# Patient Record
Sex: Female | Born: 1995 | Marital: Single | Smoking: Never smoker
Health system: Southern US, Community
[De-identification: ages and names within clinical notes are randomized; demographics above are authoritative.]

## PROBLEM LIST (undated history)

## (undated) DIAGNOSIS — T4145XA Adverse effect of unspecified anesthetic, initial encounter: Secondary | ICD-10-CM

## (undated) DIAGNOSIS — Z8619 Personal history of other infectious and parasitic diseases: Secondary | ICD-10-CM

## (undated) DIAGNOSIS — Z87442 Personal history of urinary calculi: Secondary | ICD-10-CM

## (undated) DIAGNOSIS — J189 Pneumonia, unspecified organism: Secondary | ICD-10-CM

## (undated) DIAGNOSIS — H905 Unspecified sensorineural hearing loss: Secondary | ICD-10-CM

## (undated) DIAGNOSIS — T8859XA Other complications of anesthesia, initial encounter: Secondary | ICD-10-CM

## (undated) HISTORY — PX: EXTERNAL EAR SURGERY: SHX627

## (undated) HISTORY — DX: Personal history of other infectious and parasitic diseases: Z86.19

## (undated) HISTORY — PX: OTHER SURGICAL HISTORY: SHX169

## (undated) HISTORY — DX: Unspecified sensorineural hearing loss: H90.5

## (undated) HISTORY — PX: WISDOM TOOTH EXTRACTION: SHX21

## (undated) HISTORY — PX: FOOT SURGERY: SHX648

---

## 2016-08-06 ENCOUNTER — Ambulatory Visit (INDEPENDENT_AMBULATORY_CARE_PROVIDER_SITE_OTHER): Payer: Managed Care, Other (non HMO) | Admitting: Family Medicine

## 2016-08-06 ENCOUNTER — Encounter: Payer: Self-pay | Admitting: Family Medicine

## 2016-08-06 VITALS — BP 112/54 | HR 78 | Temp 98.8°F | Ht 61.75 in | Wt 111.8 lb

## 2016-08-06 DIAGNOSIS — Z23 Encounter for immunization: Secondary | ICD-10-CM

## 2016-08-06 DIAGNOSIS — M940 Chondrocostal junction syndrome [Tietze]: Secondary | ICD-10-CM | POA: Diagnosis not present

## 2016-08-06 DIAGNOSIS — M546 Pain in thoracic spine: Secondary | ICD-10-CM | POA: Diagnosis not present

## 2016-08-06 MED ORDER — NAPROXEN 500 MG PO TABS: 500.0000 mg | ORAL_TABLET | Freq: Two times a day (BID) | ORAL | 0 refills | Status: DC

## 2016-08-06 MED ORDER — CYCLOBENZAPRINE HCL 10 MG PO TABS: 10.0000 mg | ORAL_TABLET | Freq: Three times a day (TID) | ORAL | 0 refills | Status: DC | PRN

## 2016-08-06 NOTE — Progress Notes (Signed)
Pre visit review using our clinic review tool, if applicable. No additional management support is needed unless otherwise documented below in the visit note. 

## 2016-08-06 NOTE — Progress Notes (Signed)
Chief Complaint  Patient presents with  . Establish Care    pt is having chest pain (upper)-started on Friday night.Also back pain mid (R) side(comes and goes).       New Patient Visit SUBJECTIVE: HPI: Jody Murray is an 20 y.o.female who is being seen for establishing care.  The patient has not been seen by a PCP in years.  Chest pain Started 6 days ago, located on upper chest. On both sides, it comes and goes. She does box frequently, no new sparring or workouts. No injuries, numbness or tingling. It is a sharp pain and rated as a 7/10. Nothing she notices makes it better or worse. She does not have the pain now. She has been using Benadryl and ibuprofen that has been helpful.    She also has been having intermittent R sided back pain. Also sharp, no numbness, tingling or weakness. She has noticed some help with the ibuprofen as well. No urinary complaints.  No Known Allergies  Past Medical History:  Diagnosis Date  . History of chicken pox    Past Surgical History:  Procedure Laterality Date  . EXTERNAL EAR SURGERY Right   . FOOT SURGERY Left   . History of kidney stones     Social History   Social History  . Marital status: Single   Social History Main Topics  . Smoking status: Never Smoker  . Smokeless tobacco: Never Used  . Alcohol use No  . Drug use: No   Family History  Problem Relation Age of Onset  . Cancer Maternal Grandmother     Breast  . Cancer Paternal Grandmother   . Cancer Paternal Grandfather      Current Outpatient Prescriptions:  .  cyclobenzaprine (FLEXERIL) 10 MG tablet, Take 1 tablet (10 mg total) by mouth 3 (three) times daily as needed for muscle spasms. If it makes you too groggy, cut pill in half., Disp: 30 tablet, Rfl: 0 .  naproxen (NAPROSYN) 500 MG tablet, Take 1 tablet (500 mg total) by mouth 2 (two) times daily with a meal., Disp: 30 tablet, Rfl: 0  Patient's last menstrual period was 07/13/2016  (approximate).  ROS Cardiovascular: + chest pain  MSK: +back pain   OBJECTIVE: BP (!) 112/54 (BP Location: Left Arm, Patient Position: Sitting, Cuff Size: Normal)   Pulse 78   Temp 98.8 F (37.1 C) (Oral)   Ht 5' 1.75" (1.568 m)   Wt 111 lb 12.8 oz (50.7 kg)   LMP 07/13/2016 (Approximate)   SpO2 (!) 78%   BMI 20.61 kg/m   Constitutional: -  VS reviewed -  Well developed, well nourished, appears stated age -  No apparent distress  Psychiatric: -  Oriented to person, place, and time -  Memory intact -  Affect and mood normal -  Fluent conversation, good eye contact -  Judgment and insight age appropriate  Eye: -  Conjunctivae clear, no discharge -  Pupils symmetric, round, reactive to light  Cardiovascular: -  RRR, no murmurs -  No LE edema  Respiratory: -  Normal respiratory effort, no accessory muscle use, no retraction -  Breath sounds equal, no wheezes, no ronchi, no crackles  Gastrointestinal: -  Bowel sounds normal -  No tenderness, no distention, no guarding, no masses  Neurological:  -  CN II - XII grossly intact -  Sensation grossly intact to light touch, equal bilaterally  Musculoskeletal: -  No clubbing, no cyanosis -  Gait normal -  I cannot  reproduce any tenderness to palpation in chest wall or right sided back pain  Skin: -  No significant lesion on inspection -  Warm and dry to palpation   ASSESSMENT/PLAN: Costochondritis - Plan: naproxen (NAPROSYN) 500 MG tablet, cyclobenzaprine (FLEXERIL) 10 MG tablet  Acute right-sided thoracic back pain - Plan: naproxen (NAPROSYN) 500 MG tablet, cyclobenzaprine (FLEXERIL) 10 MG tablet  Orders as above. Take 1/2 tab of Flexeril if she gets groggy. Patient should return in 1 yr or if symptoms worsen or fail to improve. The patient voiced understanding and agreement to the plan.   Jilda Rocheicholas Paul BentonWendling, DO 08/06/16  4:17 PM

## 2016-08-06 NOTE — Addendum Note (Signed)
Addended by: Verdie ShireBAYNES, ANGELA M on: 08/06/2016 05:01 PM   Modules accepted: Orders

## 2016-08-06 NOTE — Patient Instructions (Addendum)

## 2016-08-13 ENCOUNTER — Ambulatory Visit (HOSPITAL_BASED_OUTPATIENT_CLINIC_OR_DEPARTMENT_OTHER)
Admission: RE | Admit: 2016-08-13 | Discharge: 2016-08-13 | Disposition: A | Discharge status: Home / Self Care | Payer: Managed Care, Other (non HMO) | Source: Ambulatory Visit | Attending: Family Medicine | Admitting: Family Medicine

## 2016-08-13 ENCOUNTER — Encounter: Payer: Self-pay | Admitting: Family Medicine

## 2016-08-13 ENCOUNTER — Telehealth: Payer: Self-pay | Admitting: Family Medicine

## 2016-08-13 ENCOUNTER — Ambulatory Visit (INDEPENDENT_AMBULATORY_CARE_PROVIDER_SITE_OTHER): Payer: Managed Care, Other (non HMO) | Admitting: Family Medicine

## 2016-08-13 VITALS — BP 106/54 | HR 105 | Temp 98.1°F | Ht 62.0 in | Wt 110.6 lb

## 2016-08-13 DIAGNOSIS — R05 Cough: Secondary | ICD-10-CM | POA: Diagnosis not present

## 2016-08-13 DIAGNOSIS — R059 Cough, unspecified: Secondary | ICD-10-CM

## 2016-08-13 MED ORDER — BENZONATATE 100 MG PO CAPS: 100.0000 mg | ORAL_CAPSULE | Freq: Two times a day (BID) | ORAL | 0 refills | Status: DC | PRN

## 2016-08-13 NOTE — Patient Instructions (Signed)
Your chest X-ray looks normal. If a radiologist sees anything abnormal, we will let you know.  Continue practicing good hand hygiene, keep drinking fluids, and cover your mouth.  Claritin (loratadine), Allegra (fexofenadine), Zyrtec (cetirizine); these are listed in order from weakest to strongest. Generic, and therefore cheaper, options are in the parentheses.   Flonase (fluticasone); nasal spray that is over the counter. 2 sprays each nostril, once daily. Aim towards the same side eye when you spray.  There are available OTC, and the generic versions, which may be cheaper, are in parentheses. Show this to a pharmacist if you have trouble finding any of these items.

## 2016-08-13 NOTE — Telephone Encounter (Signed)
Do you hav eany recommendations before I reach out to the patient. TL/CMA

## 2016-08-13 NOTE — Progress Notes (Signed)
Pre visit review using our clinic review tool, if applicable. No additional management support is needed unless otherwise documented below in the visit note. 

## 2016-08-13 NOTE — Telephone Encounter (Signed)
Pt's mom says that she would like to be advised. She says that her daughter is having some other concerns. When seen she was complaining about chest pain. She says now she is vomiting along with it. She would like a call back.    OR if CMA would like, could call the pt instead at # on file.    Valentina GuLucy (mom) [phone]

## 2016-08-13 NOTE — Telephone Encounter (Signed)
I don't recall her having vomiting when I saw her. I would prefer she be seen.

## 2016-08-13 NOTE — Telephone Encounter (Signed)
I have spoken with Jody Murray who informed me that she currently does not have chest pain and has stopped coughing. But when I have spoken with her mom she states that she has been coughing up yellow phlem with black strings in it. Pt mother reports she had chest pain last night and had to run to the bathroom several times due to the gagging and choking of the phlem coming up. Pt states the naproxen has helped but muscle relaxant is causing drowsiness. I have scheduled patient for 215 to come see Dr. Carmelia RollerWendling. FYI only.TL/CMA

## 2016-08-13 NOTE — Progress Notes (Signed)
Chief Complaint  Patient presents with  . Follow-up    Pt reports having chest pain last night and still coughing up yellow phlem with black strings in it/ Non smoker and has history of pneumonia    Jody Murray here for URI complaints.  Duration: 2 days  Associated symptoms: felt warm, productive cough Denies: sinus congestion, rhinorrhea, itchy watery eyes, ear pain, ear drainage, sore throat and shortness of breath; of note, no N/V Treatment to date: fluids and rest Sick contacts: No  ROS:  HEENT: As noted in HPI Lungs: No SOB  Past Medical History:  Diagnosis Date  . History of chicken pox    Family History  Problem Relation Age of Onset  . Cancer Maternal Grandmother     Breast  . Cancer Paternal Grandmother   . Cancer Paternal Grandfather     BP (!) 106/54 (BP Location: Left Arm, Patient Position: Sitting, Cuff Size: Small)   Pulse (!) 105   Temp 98.1 F (36.7 C) (Oral)   Ht 5\' 2"  (1.575 m)   Wt 110 lb 9.6 oz (50.2 kg)   LMP 07/11/2016   SpO2 95%   BMI 20.23 kg/m  General: Awake, alert, appears stated age HEENT: AT, [facility], ears patent b/l and TM's neg, nares patent w/o discharge, no sinus tenderness, pharynx pink and without exudates, MMM Neck: No masses or asymmetry Heart: RRR, no murmurs, no bruits Lungs: CTAB, no accessory muscle use Psych: Age appropriate judgment and insight, normal mood and affect  Cough - Plan: DG Chest 2 View, benzonatate (TESSALON) 100 MG capsule  Orders as above. CXR neg, await final read. Seek more immediate care if she starts having fevers, shortness of breath, new symptoms. F/u in 1 week if symptoms worsen or fail to improve. Pt voiced understanding and agreement to the plan.  Jody Rocheicholas Paul MartellWendling, DO 08/13/16 3:07 PM

## 2018-01-26 ENCOUNTER — Ambulatory Visit (INDEPENDENT_AMBULATORY_CARE_PROVIDER_SITE_OTHER): Payer: Commercial Managed Care - PPO | Admitting: Family Medicine

## 2018-01-26 ENCOUNTER — Encounter: Payer: Self-pay | Admitting: Family Medicine

## 2018-01-26 ENCOUNTER — Ambulatory Visit (HOSPITAL_BASED_OUTPATIENT_CLINIC_OR_DEPARTMENT_OTHER)
Admission: RE | Admit: 2018-01-26 | Discharge: 2018-01-26 | Disposition: A | Discharge status: Home / Self Care | Payer: Commercial Managed Care - PPO | Source: Ambulatory Visit | Attending: Family Medicine | Admitting: Family Medicine

## 2018-01-26 VITALS — BP 102/62 | HR 81 | Temp 98.9°F | Ht 63.0 in | Wt 109.5 lb

## 2018-01-26 DIAGNOSIS — M79645 Pain in left finger(s): Secondary | ICD-10-CM | POA: Diagnosis not present

## 2018-01-26 DIAGNOSIS — Z114 Encounter for screening for human immunodeficiency virus [HIV]: Secondary | ICD-10-CM | POA: Diagnosis not present

## 2018-01-26 DIAGNOSIS — Z0000 Encounter for general adult medical examination without abnormal findings: Secondary | ICD-10-CM

## 2018-01-26 DIAGNOSIS — Z0001 Encounter for general adult medical examination with abnormal findings: Secondary | ICD-10-CM | POA: Diagnosis not present

## 2018-01-26 DIAGNOSIS — Z87442 Personal history of urinary calculi: Secondary | ICD-10-CM | POA: Diagnosis not present

## 2018-01-26 LAB — COMPREHENSIVE METABOLIC PANEL
ALBUMIN: 4.9 g/dL (ref 3.5–5.2)
ALK PHOS: 55 U/L (ref 39–117)
ALT: 15 U/L (ref 0–35)
AST: 17 U/L (ref 0–37)
BILIRUBIN TOTAL: 0.6 mg/dL (ref 0.2–1.2)
BUN: 13 mg/dL (ref 6–23)
CO2: 31 mEq/L (ref 19–32)
Calcium: 10.1 mg/dL (ref 8.4–10.5)
Chloride: 100 mEq/L (ref 96–112)
Creatinine, Ser: 0.61 mg/dL (ref 0.40–1.20)
GFR: 130.86 mL/min (ref >60.00)
Glucose, Bld: 97 mg/dL (ref 70–99)
POTASSIUM: 3.9 meq/L (ref 3.5–5.1)
SODIUM: 138 meq/L (ref 135–145)
Total Protein: 7.7 g/dL (ref 6.0–8.3)

## 2018-01-26 LAB — LIPID PANEL
CHOLESTEROL: 165 mg/dL (ref 0–200)
HDL: 46.6 mg/dL (ref >39.00)
LDL Cholesterol: 83 mg/dL (ref 0–99)
NonHDL: 118.14
Total CHOL/HDL Ratio: 4
Triglycerides: 176 mg/dL — ABNORMAL HIGH (ref 0.0–149.0)
VLDL: 35.2 mg/dL (ref 0.0–40.0)

## 2018-01-26 LAB — CBC
HEMATOCRIT: 40.7 % (ref 36.0–46.0)
HEMOGLOBIN: 13.7 g/dL (ref 12.0–15.0)
MCHC: 33.6 g/dL (ref 30.0–36.0)
MCV: 98.5 fl (ref 78.0–100.0)
Platelets: 259 10*3/uL (ref 150.0–400.0)
RBC: 4.13 Mil/uL (ref 3.87–5.11)
RDW: 12.7 % (ref 11.5–15.5)
WBC: 8.7 10*3/uL (ref 4.0–10.5)

## 2018-01-26 MED ORDER — TAMSULOSIN HCL 0.4 MG PO CAPS: 0.4000 mg | ORAL_CAPSULE | Freq: Every day | ORAL | 0 refills | Status: DC

## 2018-01-26 MED ORDER — TRAMADOL HCL 50 MG PO TABS: 50.0000 mg | ORAL_TABLET | Freq: Three times a day (TID) | ORAL | 0 refills | Status: DC | PRN

## 2018-01-26 NOTE — Patient Instructions (Addendum)
Keep up the good work.   MyChart message will be sent regarding both labs and for X-ray results.  If you do not hear anything about your referral in the next 1-2 weeks, call our office and ask for an update.  Let us know if you need anything.

## 2018-01-26 NOTE — Progress Notes (Signed)
Pre visit review using our clinic review tool, if applicable. No additional management support is needed unless otherwise documented below in the visit note. 

## 2018-01-26 NOTE — Progress Notes (Signed)
Chief Complaint  Patient presents with  . Annual Exam     Well Woman Jody Murray is here for a complete physical.  Here w mom. Her last physical was >1 year ago.  Current diet: in general, a "healthy" diet. Current exercise: Boxing, cardio, pull ups. Weight is stable and she denies daytime fatigue. No LMP recorded. Seatbelt? Yes  Health Maintenance Pap/HPV- No Tetanus- Yes HIV screening- No   Hx of kidney stones since she was 22 yo. Saw urology team in [place], has not had since coming here. 2 weeks of migrating flank pain with some radiation to and reminiscent of renal stones. No urinary complaints or fevers.  Broke finger around 1 year ago, prox phalanx of R 2nd digit. Still will cause pain when it is squeezed. Questionable fx care? No swelling or bruising. Has not had f/u imaging.  Past Medical History:  Diagnosis Date  . History of chicken pox      Past Surgical History:  Procedure Laterality Date  . EXTERNAL EAR SURGERY Right   . FOOT SURGERY Left   . History of kidney stones      Medications  Takes no meds routinely.   Allergies No Known Allergies  Review of Systems: Constitutional:  no fevers Eye:  no recent significant change in vision Ear/Nose/Mouth/Throat:  Ears:  no tinnitus or vertigo and no recent change in hearing, Nose/Mouth/Throat:  no complaints of nasal congestion, no sore throat Cardiovascular: no chest pain, no palpitations Respiratory:  no cough and no shortness of breath Gastrointestinal:  no abdominal pain, no change in bowel habits, no significant change in appetite, no nausea, vomiting, diarrhea, or constipation and no black or bloody stool GU:  Female: negative for dysuria, frequency, and incontinence; no abnormal bleeding, pelvic pain, or discharge Musculoskeletal/Extremities: +L index finger pain, +flank pain; otherwise no pain of the joints Integumentary (Skin/Breast):  no abnormal skin lesions reported Neurologic:  no headaches Endocrine:   denies fatigue Hematologic/Lymphatic:  no unexpected weight changes  Exam BP 102/62 (BP Location: Right Arm, Patient Position: Sitting, Cuff Size: Normal)   Pulse 81   Temp 98.9 F (37.2 C) (Oral)   Ht 5\' 3"  (1.6 m)   Wt 109 lb 8 oz (49.7 kg)   SpO2 97%   BMI 19.40 kg/m  General:  well developed, well nourished, in no apparent distress Skin:  no significant moles, warts, or growths Head:  no masses, lesions, or tenderness Eyes:  pupils equal and round, sclera anicteric without injection Ears:  canals without lesions, TMs shiny without retraction, no obvious effusion, no erythema Nose:  nares patent, septum midline, mucosa normal, and no drainage or sinus tenderness Throat/Pharynx:  lips and gingiva without lesion; tongue and uvula midline; non-inflamed pharynx; no exudates or postnasal drainage Neck: neck supple without adenopathy, thyromegaly, or masses Breasts:  Not done Thorax:  nontender Lungs:  clear to auscultation, breath sounds equal bilaterally, no respiratory distress Cardio:  regular rate and rhythm without murmurs, heart sounds without clicks or rubs, point of maximal impulse normal; no lifts, heaves, or thrills Abdomen:  abdomen soft, nontender; bowel sounds normal; no masses or organomegaly Genital: Defer to GYN Musculoskeletal:  symmetrical muscle groups noted without atrophy or deformity Extremities:  no clubbing, cyanosis, or edema, no deformities, no skin discoloration Neuro:  gait normal; deep tendon reflexes normal and symmetric Psych: well oriented with normal range of affect and appropriate judgment/insight  Assessment and Plan  Well adult exam - Plan: CBC, Comprehensive metabolic panel, Lipid  panel  History of kidney stones - Plan: Ambulatory referral to Urology  Finger pain, left - Plan: DG Finger Index Left  Screening for HIV (human immunodeficiency virus) - Plan: HIV antibody   Well 22 y.o. female. Counseled on diet and exercise. Doing  well. Flomax, tramadol and referral to urology for history of kidney stones.  Ck XR for finger to ensure adequate healing, may refer to hand pending results. Other orders as above. Follow up in 1 yr or prn. The patient and her mom voiced understanding and agreement to the plan.  Jilda Rocheicholas Paul Grove CityWendling, DO 01/26/18 2:45 PM

## 2018-01-27 LAB — HIV ANTIBODY (ROUTINE TESTING W REFLEX): HIV 1&2 Ab, 4th Generation: NONREACTIVE

## 2018-01-31 ENCOUNTER — Encounter: Payer: Self-pay | Admitting: Family Medicine

## 2018-02-01 ENCOUNTER — Other Ambulatory Visit: Payer: Self-pay | Admitting: Family Medicine

## 2018-02-01 DIAGNOSIS — M79645 Pain in left finger(s): Secondary | ICD-10-CM

## 2018-06-07 ENCOUNTER — Telehealth: Payer: Self-pay

## 2018-06-07 ENCOUNTER — Emergency Department (HOSPITAL_BASED_OUTPATIENT_CLINIC_OR_DEPARTMENT_OTHER): Payer: Commercial Managed Care - PPO

## 2018-06-07 ENCOUNTER — Other Ambulatory Visit: Payer: Self-pay

## 2018-06-07 ENCOUNTER — Emergency Department (HOSPITAL_BASED_OUTPATIENT_CLINIC_OR_DEPARTMENT_OTHER)
Admission: EM | Admit: 2018-06-07 | Discharge: 2018-06-07 | Disposition: A | Discharge status: Home / Self Care | Payer: Commercial Managed Care - PPO | Attending: Emergency Medicine | Admitting: Emergency Medicine

## 2018-06-07 ENCOUNTER — Encounter (HOSPITAL_BASED_OUTPATIENT_CLINIC_OR_DEPARTMENT_OTHER): Payer: Self-pay | Admitting: Emergency Medicine

## 2018-06-07 DIAGNOSIS — Z79899 Other long term (current) drug therapy: Secondary | ICD-10-CM | POA: Insufficient documentation

## 2018-06-07 DIAGNOSIS — N201 Calculus of ureter: Secondary | ICD-10-CM | POA: Diagnosis not present

## 2018-06-07 DIAGNOSIS — R1032 Left lower quadrant pain: Secondary | ICD-10-CM | POA: Diagnosis present

## 2018-06-07 DIAGNOSIS — R109 Unspecified abdominal pain: Secondary | ICD-10-CM

## 2018-06-07 LAB — URINALYSIS, ROUTINE W REFLEX MICROSCOPIC
BILIRUBIN URINE: NEGATIVE
Glucose, UA: NEGATIVE mg/dL
Hgb urine dipstick: NEGATIVE
KETONES UR: 15 mg/dL — AB
NITRITE: NEGATIVE
PROTEIN: 100 mg/dL — AB
Specific Gravity, Urine: 1.01 (ref 1.005–1.030)

## 2018-06-07 LAB — PREGNANCY, URINE: PREG TEST UR: NEGATIVE

## 2018-06-07 LAB — URINALYSIS, MICROSCOPIC (REFLEX)

## 2018-06-07 MED ORDER — KETOROLAC TROMETHAMINE 30 MG/ML IJ SOLN
30.0000 mg | Freq: Once | INTRAMUSCULAR | Status: AC
  Administered 2018-06-07: 30 mg via INTRAVENOUS
  Filled 2018-06-07: qty 1

## 2018-06-07 MED ORDER — HYDROMORPHONE HCL 1 MG/ML IJ SOLN
1.0000 mg | Freq: Once | INTRAMUSCULAR | Status: AC
  Administered 2018-06-07: 1 mg via INTRAVENOUS
  Filled 2018-06-07: qty 1

## 2018-06-07 MED ORDER — HYDROCODONE-ACETAMINOPHEN 5-325 MG PO TABS: 1.0000 | ORAL_TABLET | Freq: Four times a day (QID) | ORAL | 0 refills | Status: DC | PRN

## 2018-06-07 MED ORDER — ONDANSETRON HCL 4 MG/2ML IJ SOLN
4.0000 mg | Freq: Once | INTRAMUSCULAR | Status: AC
  Administered 2018-06-07: 4 mg via INTRAVENOUS
  Filled 2018-06-07: qty 2

## 2018-06-07 MED ORDER — KETOROLAC TROMETHAMINE 10 MG PO TABS: 10.0000 mg | ORAL_TABLET | Freq: Four times a day (QID) | ORAL | 0 refills | Status: DC | PRN

## 2018-06-07 MED ORDER — HYDROMORPHONE HCL 1 MG/ML IJ SOLN: 1.0000 mg | Freq: Once | INTRAMUSCULAR | Status: DC

## 2018-06-07 MED ORDER — SODIUM CHLORIDE 0.9 % IV BOLUS
1000.0000 mL | Freq: Once | INTRAVENOUS | Status: AC
  Administered 2018-06-07: 1000 mL via INTRAVENOUS

## 2018-06-07 MED ORDER — ONDANSETRON 4 MG PO TBDP: 4.0000 mg | ORAL_TABLET | Freq: Three times a day (TID) | ORAL | 1 refills | Status: DC | PRN

## 2018-06-07 MED ORDER — SODIUM CHLORIDE 0.9 % IV SOLN
INTRAVENOUS | Status: DC
  Administered 2018-06-07: 11:00:00 via INTRAVENOUS

## 2018-06-07 MED FILL — ONDANSETRON ODT 4 MG TABLET: 4 | 3 days supply | Qty: 10 | Fill #0

## 2018-06-07 MED FILL — HYDROCODON-APAP 5-325: 5-325 | 2 days supply | Qty: 10 | Fill #0

## 2018-06-07 MED FILL — KETOROLAC 10 MG TABLET: 10 | 5 days supply | Qty: 20 | Fill #0

## 2018-06-07 NOTE — ED Triage Notes (Signed)
Patient reports left flank pain and difficulty with urination since 0530 this morning.  Reports nausea and vomiting.

## 2018-06-07 NOTE — ED Notes (Signed)
Dilaudid held at this time d/t pt denies pain

## 2018-06-07 NOTE — ED Provider Notes (Signed)
[facility] EMERGENCY DEPARTMENT Provider Note   CSN: [id number] Arrival date & time: 06/07/18  [id number]     History   Chief Complaint Chief Complaint  Patient presents with  . Flank Pain    HPI Jody Murray is a 22 y.o. female.  Patient with acute onset of left flank pain at about 540 this morning.  Associated with nausea and vomiting.  The pain is been severe.  There was some discomfort last evening as well as some nausea.  Patient has a history of kidney stones and she has been followed by [facility] in the past.     Past Medical History:  Diagnosis Date  . History of chicken pox     Patient Active Problem List   Diagnosis Date Noted  . History of kidney stones 01/26/2018    Past Surgical History:  Procedure Laterality Date  . EXTERNAL EAR SURGERY Right   . FOOT SURGERY Left   . History of kidney stones       OB History   None      Home Medications    Prior to Admission medications   Medication Sig Start Date End Date Taking? Authorizing Provider  HYDROcodone-acetaminophen (NORCO/VICODIN) 5-325 MG tablet Take 1-2 tablets by mouth every 6 (six) hours as needed for moderate pain. 06/07/18   Vanetta MuldersZackowski, Aaryana Betke, MD  ketorolac (TORADOL) 10 MG tablet Take 1 tablet (10 mg total) by mouth every 6 (six) hours as needed. 06/07/18   Vanetta MuldersZackowski, Reine Bristow, MD  ondansetron (ZOFRAN ODT) 4 MG disintegrating tablet Take 1 tablet (4 mg total) by mouth every 8 (eight) hours as needed. 06/07/18   Vanetta MuldersZackowski, Javell Blackburn, MD  tamsulosin (FLOMAX) 0.4 MG CAPS capsule Take 1 capsule (0.4 mg total) by mouth daily. 01/26/18   Wendling, Jilda RocheNicholas Paul, DO  traMADol (ULTRAM) 50 MG tablet Take 1 tablet (50 mg total) by mouth every 8 (eight) hours as needed for moderate pain. 01/26/18   Sharlene DoryWendling, Nicholas Paul, DO    Family History Family History  Problem Relation Age of Onset  . Cancer Maternal Grandmother        Breast  . Cancer Paternal Grandmother   . Cancer Paternal Grandfather       Social History Social History   Tobacco Use  . Smoking status: Never Smoker  . Smokeless tobacco: Never Used  Substance Use Topics  . Alcohol use: No  . Drug use: No     Allergies   Patient has no known allergies.   Review of Systems Review of Systems  Constitutional: Negative for fever.  HENT: Negative for congestion.   Respiratory: Negative for shortness of breath.   Cardiovascular: Negative for chest pain.  Gastrointestinal: Positive for nausea and vomiting. Negative for abdominal pain.  Genitourinary: Positive for flank pain. Negative for dysuria and hematuria.  Musculoskeletal: Negative for back pain.  Skin: Negative for rash.  Neurological: Negative for syncope.  Hematological: Does not bruise/bleed easily.  Psychiatric/Behavioral: The patient is nervous/anxious.      Physical Exam Updated Vital Signs BP 103/60 (BP Location: Left Arm)   Pulse 85   Temp 97.8 F (36.6 C) (Oral)   Resp 18   Ht 1.575 m (5\' 2" )   Wt 49.9 kg   LMP 06/01/2018 (Approximate)   SpO2 99%   BMI 20.12 kg/m   Physical Exam  Constitutional: She is oriented to person, place, and time. She appears well-developed and well-nourished. She appears distressed.  HENT:  Head: Normocephalic and atraumatic.  Mouth/Throat:  Oropharynx is clear and moist.  Eyes: Pupils are equal, round, and reactive to light. Conjunctivae and EOM are normal.  Neck: Neck supple.  Cardiovascular: Normal rate, regular rhythm and normal heart sounds.  Pulmonary/Chest: Effort normal and breath sounds normal. No respiratory distress.  Abdominal: Soft. Bowel sounds are normal.  Musculoskeletal: Normal range of motion. She exhibits no edema.  Neurological: She is alert and oriented to person, place, and time. No cranial nerve deficit. She exhibits normal muscle tone. Coordination normal.  Skin: Skin is warm.  Nursing note and vitals reviewed.    ED Treatments / Results  Labs (all labs ordered are listed, but  only abnormal results are displayed) Labs Reviewed  URINALYSIS, ROUTINE W REFLEX MICROSCOPIC - Abnormal; Notable for the following components:      Result Value   APPearance CLOUDY (*)    pH >9.0 (*)    Ketones, ur 15 (*)    Protein, ur 100 (*)    Leukocytes, UA TRACE (*)    All other components within normal limits  URINALYSIS, MICROSCOPIC (REFLEX) - Abnormal; Notable for the following components:   Bacteria, UA FEW (*)    All other components within normal limits  PREGNANCY, URINE    EKG None  Radiology Ct Renal Stone Study  Result Date: 06/07/2018 CLINICAL DATA:  Acute left flank pain. EXAM: CT ABDOMEN AND PELVIS WITHOUT CONTRAST TECHNIQUE: Multidetector CT imaging of the abdomen and pelvis was performed following the standard protocol without IV contrast. COMPARISON:  None. FINDINGS: Lower chest: No acute abnormality. Hepatobiliary: No focal liver abnormality is seen. No gallstones, gallbladder wall thickening, or biliary dilatation. Pancreas: Unremarkable. No pancreatic ductal dilatation or surrounding inflammatory changes. Spleen: Normal in size without focal abnormality. Adrenals/Urinary Tract: Adrenal glands appear normal. Minimal right nephrolithiasis is noted. Minimal left hydroureteronephrosis is noted secondary to 5 mm calculus in distal left ureter. Urinary bladder is unremarkable. Stomach/Bowel: Stomach is within normal limits. Appendix appears normal. No evidence of bowel wall thickening, distention, or inflammatory changes. Vascular/Lymphatic: No significant vascular findings are present. No enlarged abdominal or pelvic lymph nodes. Reproductive: Uterus and bilateral adnexa are unremarkable. Other: No abdominal wall hernia or abnormality. No abdominopelvic ascites. Musculoskeletal: No acute or significant osseous findings. IMPRESSION: Minimal right nephrolithiasis. Minimal left hydroureteronephrosis is noted secondary to 5 mm distal left ureteral calculus. Electronically Signed    By: Lupita Raider, M.D.   On: 06/07/2018 11:01    Procedures Procedures (including critical care time)  Medications Ordered in ED Medications  0.9 %  sodium chloride infusion ( Intravenous New Bag/Given 06/07/18 1108)  HYDROmorphone (DILAUDID) injection 1 mg (has no administration in time range)  ondansetron (ZOFRAN) injection 4 mg (4 mg Intravenous Given 06/07/18 0923)  sodium chloride 0.9 % bolus 1,000 mL (0 mLs Intravenous Stopped 06/07/18 1114)  HYDROmorphone (DILAUDID) injection 1 mg (1 mg Intravenous Given 06/07/18 0959)  ketorolac (TORADOL) 30 MG/ML injection 30 mg (30 mg Intravenous Given 06/07/18 0958)  HYDROmorphone (DILAUDID) injection 1 mg (1 mg Intravenous Given 06/07/18 1110)  ondansetron (ZOFRAN) injection 4 mg (4 mg Intravenous Given 06/07/18 1206)     Initial Impression / Assessment and Plan / ED Course  I have reviewed the triage vital signs and the nursing notes.  Pertinent labs & imaging results that were available during my care of the patient were reviewed by me and considered in my medical decision making (see chart for details).     CT scan confirmatory for a 5 mm distal left  ureter stone.  Consistent with her symptoms.  Urinalysis without evidence of infection also no evidence of hematuria.  Patient's pain difficult to control here but controlled or improved with IV Toradol IV Zofran and hydromorphone.  Patient also received 1 L of fluids because she still been struggling with persistent nausea and small amounts of vomiting.  But overall less frequent and pain is better than when she arrived.  Patient given referral back to follow-up with [facility].  Be discharged home on p.o. Toradol at her request, hydrocodone, Zofran, and will continue her Flomax.  Final Clinical Impressions(s) / ED Diagnoses   Final diagnoses:  Ureterolithiasis  Left flank pain    ED Discharge Orders         Ordered    HYDROcodone-acetaminophen (NORCO/VICODIN) 5-325 MG tablet   Every 6 hours PRN,   Status:  Discontinued     06/07/18 1400    ondansetron (ZOFRAN ODT) 4 MG disintegrating tablet  Every 8 hours PRN     06/07/18 1400    ketorolac (TORADOL) 10 MG tablet  Every 6 hours PRN     06/07/18 1400    HYDROcodone-acetaminophen (NORCO/VICODIN) 5-325 MG tablet  Every 6 hours PRN     06/07/18 1403           Vanetta MuldersZackowski, Paislea Hatton, MD 06/07/18 1409

## 2018-06-07 NOTE — Telephone Encounter (Signed)
Copied from CRM [phone]. Topic: General - Other >> Jun 07, 2018 10:23 AM Maia Pettiesrtiz, Kristie S wrote: Reason for CRM: pts mother called to notify Dr. Carmelia RollerWendling that [facility]. She is hoping that Dr. Carmelia RollerWendling has a moment to go down to the ER and see pt and analyze the situation. She stated that pt trusts Dr. Carmelia RollerWendling and that is why she [facility].

## 2018-06-07 NOTE — Telephone Encounter (Signed)
Pt's mother called b/c they are downstairs in the ER; they have been notified that Dr. Carmelia RollerWendling is not in the office today but would like to speak w/ him; hopefully at least someone w/in the office could contact pt to be advised

## 2018-06-07 NOTE — Discharge Instructions (Signed)
Make an appointment to follow back up with [facility].  School note provided.  Take the Toradol and Zofran on a regular basis supplement with the hydrocodone as needed additional pain relief.  Continue your Flomax.

## 2018-06-08 NOTE — Telephone Encounter (Signed)
Even if I was in office, I usually don't go down to ER to consult. Hope she is feeling better. TY.

## 2018-06-08 NOTE — Telephone Encounter (Signed)
Author phoned pt. for courtesy follow-up ED visit, as pt. had requested Dr. Carmelia RollerWendling to evaluate when he was out of the office. Author left detailed VM, reinforcing pt. to follow-up with urology per ED discharge notes, and told pt. to reach out for any questions or concerns [phone].

## 2018-06-09 ENCOUNTER — Telehealth: Payer: Self-pay

## 2018-06-09 NOTE — Telephone Encounter (Signed)
Called patietn to see if she needed ED follow up appointment scheduled. Left message to return call.

## 2018-06-17 ENCOUNTER — Encounter (HOSPITAL_BASED_OUTPATIENT_CLINIC_OR_DEPARTMENT_OTHER)
Admission: RE | Disposition: A | Discharge status: Home / Self Care | Payer: Self-pay | Source: Ambulatory Visit | Attending: Urology

## 2018-06-17 ENCOUNTER — Encounter (HOSPITAL_BASED_OUTPATIENT_CLINIC_OR_DEPARTMENT_OTHER): Payer: Self-pay | Admitting: Anesthesiology

## 2018-06-17 ENCOUNTER — Ambulatory Visit (HOSPITAL_BASED_OUTPATIENT_CLINIC_OR_DEPARTMENT_OTHER): Payer: Commercial Managed Care - PPO | Admitting: Anesthesiology

## 2018-06-17 ENCOUNTER — Ambulatory Visit (HOSPITAL_BASED_OUTPATIENT_CLINIC_OR_DEPARTMENT_OTHER)
Admission: RE | Admit: 2018-06-17 | Discharge: 2018-06-17 | Disposition: A | Discharge status: Home / Self Care | Payer: Commercial Managed Care - PPO | Source: Ambulatory Visit | Attending: Urology | Admitting: Urology

## 2018-06-17 ENCOUNTER — Other Ambulatory Visit: Payer: Self-pay

## 2018-06-17 ENCOUNTER — Other Ambulatory Visit: Payer: Self-pay | Admitting: Urology

## 2018-06-17 DIAGNOSIS — N132 Hydronephrosis with renal and ureteral calculous obstruction: Secondary | ICD-10-CM | POA: Diagnosis not present

## 2018-06-17 DIAGNOSIS — N201 Calculus of ureter: Secondary | ICD-10-CM

## 2018-06-17 HISTORY — DX: Pneumonia, unspecified organism: J18.9

## 2018-06-17 HISTORY — DX: Personal history of urinary calculi: Z87.442

## 2018-06-17 HISTORY — DX: Other complications of anesthesia, initial encounter: T88.59XA

## 2018-06-17 HISTORY — DX: Adverse effect of unspecified anesthetic, initial encounter: T41.45XA

## 2018-06-17 HISTORY — PX: URETEROSCOPY WITH HOLMIUM LASER LITHOTRIPSY: SHX6645

## 2018-06-17 SURGERY — URETEROSCOPY, WITH LITHOTRIPSY USING HOLMIUM LASER
Anesthesia: General | Site: Ureter | Laterality: Left

## 2018-06-17 MED ORDER — PHENAZOPYRIDINE HCL 200 MG PO TABS: 200.0000 mg | ORAL_TABLET | Freq: Three times a day (TID) | ORAL | 0 refills | Status: DC | PRN

## 2018-06-17 MED ORDER — SODIUM CHLORIDE 0.9 % IR SOLN
Status: DC | PRN
  Administered 2018-06-17: 3000 mL via INTRAVESICAL

## 2018-06-17 MED ORDER — DEXAMETHASONE SODIUM PHOSPHATE 10 MG/ML IJ SOLN
INTRAMUSCULAR | Status: DC | PRN
  Administered 2018-06-17: 10 mg via INTRAVENOUS

## 2018-06-17 MED ORDER — CIPROFLOXACIN IN D5W 200 MG/100ML IV SOLN
200.0000 mg | Freq: Once | INTRAVENOUS | Status: AC
  Administered 2018-06-17: 200 mg via INTRAVENOUS
  Filled 2018-06-17: qty 100

## 2018-06-17 MED ORDER — ONDANSETRON HCL 4 MG/2ML IJ SOLN
INTRAMUSCULAR | Status: AC
  Filled 2018-06-17: qty 2

## 2018-06-17 MED ORDER — MIDAZOLAM HCL 5 MG/5ML IJ SOLN
INTRAMUSCULAR | Status: DC | PRN
  Administered 2018-06-17: 1 mg via INTRAVENOUS

## 2018-06-17 MED ORDER — KETOROLAC TROMETHAMINE 30 MG/ML IJ SOLN
INTRAMUSCULAR | Status: AC
  Filled 2018-06-17: qty 1

## 2018-06-17 MED ORDER — CIPROFLOXACIN IN D5W 200 MG/100ML IV SOLN
INTRAVENOUS | Status: AC
  Filled 2018-06-17: qty 100

## 2018-06-17 MED ORDER — LACTATED RINGERS IV SOLN
INTRAVENOUS | Status: DC
  Administered 2018-06-17: 1000 mL via INTRAVENOUS
  Filled 2018-06-17: qty 1000

## 2018-06-17 MED ORDER — HYDROCODONE-ACETAMINOPHEN 10-325 MG PO TABS: 1.0000 | ORAL_TABLET | ORAL | 0 refills | Status: DC | PRN

## 2018-06-17 MED ORDER — FENTANYL CITRATE (PF) 100 MCG/2ML IJ SOLN
25.0000 ug | INTRAMUSCULAR | Status: DC | PRN
  Administered 2018-06-17 (×2): 50 ug via INTRAVENOUS
  Filled 2018-06-17: qty 1

## 2018-06-17 MED ORDER — LIDOCAINE 2% (20 MG/ML) 5 ML SYRINGE
INTRAMUSCULAR | Status: DC | PRN
  Administered 2018-06-17: 40 mg via INTRAVENOUS

## 2018-06-17 MED ORDER — MIDAZOLAM HCL 2 MG/2ML IJ SOLN
INTRAMUSCULAR | Status: AC
  Filled 2018-06-17: qty 2

## 2018-06-17 MED ORDER — DEXAMETHASONE SODIUM PHOSPHATE 10 MG/ML IJ SOLN
INTRAMUSCULAR | Status: AC
  Filled 2018-06-17: qty 1

## 2018-06-17 MED ORDER — OXYCODONE HCL 5 MG/5ML PO SOLN
5.0000 mg | Freq: Once | ORAL | Status: DC | PRN
  Filled 2018-06-17: qty 5

## 2018-06-17 MED ORDER — LACTATED RINGERS IV SOLN
INTRAVENOUS | Status: DC
  Filled 2018-06-17: qty 1000

## 2018-06-17 MED ORDER — OXYCODONE HCL 5 MG PO TABS
5.0000 mg | ORAL_TABLET | Freq: Once | ORAL | Status: DC | PRN
  Filled 2018-06-17: qty 1

## 2018-06-17 MED ORDER — FENTANYL CITRATE (PF) 100 MCG/2ML IJ SOLN
INTRAMUSCULAR | Status: AC
  Filled 2018-06-17: qty 2

## 2018-06-17 MED ORDER — LIDOCAINE 2% (20 MG/ML) 5 ML SYRINGE
INTRAMUSCULAR | Status: AC
  Filled 2018-06-17: qty 5

## 2018-06-17 MED ORDER — MEPERIDINE HCL 25 MG/ML IJ SOLN
6.2500 mg | INTRAMUSCULAR | Status: DC | PRN
  Filled 2018-06-17: qty 1

## 2018-06-17 MED ORDER — PROMETHAZINE HCL 25 MG/ML IJ SOLN
6.2500 mg | INTRAMUSCULAR | Status: DC | PRN
  Filled 2018-06-17: qty 1

## 2018-06-17 MED ORDER — PROPOFOL 10 MG/ML IV BOLUS
INTRAVENOUS | Status: AC
  Filled 2018-06-17: qty 20

## 2018-06-17 MED ORDER — PROPOFOL 10 MG/ML IV BOLUS
INTRAVENOUS | Status: DC | PRN
  Administered 2018-06-17: 150 mg via INTRAVENOUS
  Administered 2018-06-17: 50 mg via INTRAVENOUS

## 2018-06-17 MED ORDER — ONDANSETRON HCL 4 MG/2ML IJ SOLN
INTRAMUSCULAR | Status: DC | PRN
  Administered 2018-06-17: 4 mg via INTRAVENOUS

## 2018-06-17 SURGICAL SUPPLY — 20 items
BAG DRAIN URO-CYSTO SKYTR STRL (DRAIN) ×2 IMPLANT
BASKET ZERO TIP NITINOL 2.4FR (BASKET) ×2 IMPLANT
CATH INTERMIT  6FR 70CM (CATHETERS) ×2 IMPLANT
CLOTH BEACON ORANGE TIMEOUT ST (SAFETY) ×2 IMPLANT
GLOVE BIO SURGEON STRL SZ 6.5 (GLOVE) ×2 IMPLANT
GLOVE BIO SURGEON STRL SZ8 (GLOVE) ×2 IMPLANT
GLOVE BIOGEL PI IND STRL 6.5 (GLOVE) ×1 IMPLANT
GLOVE BIOGEL PI INDICATOR 6.5 (GLOVE) ×1
GOWN STRL REUS W/ TWL LRG LVL3 (GOWN DISPOSABLE) ×1 IMPLANT
GOWN STRL REUS W/TWL LRG LVL3 (GOWN DISPOSABLE) ×1
GOWN STRL REUS W/TWL XL LVL3 (GOWN DISPOSABLE) ×2 IMPLANT
GUIDEWIRE STR DUAL SENSOR (WIRE) ×2 IMPLANT
IV NS IRRIG 3000ML ARTHROMATIC (IV SOLUTION) ×2 IMPLANT
KIT TURNOVER CYSTO (KITS) ×2 IMPLANT
MANIFOLD NEPTUNE II (INSTRUMENTS) ×2 IMPLANT
NS IRRIG 500ML POUR BTL (IV SOLUTION) ×2 IMPLANT
PACK CYSTO (CUSTOM PROCEDURE TRAY) ×2 IMPLANT
SHEATH ACCESS URETERAL 24CM (SHEATH) ×2 IMPLANT
TUBE CONNECTING 12X1/4 (SUCTIONS) ×2 IMPLANT
TUBING UROLOGY SET (TUBING) ×2 IMPLANT

## 2018-06-17 NOTE — Discharge Instructions (Signed)
Cystoscopy patient instructions  Following a cystoscopy, a catheter (a flexible rubber tube) is sometimes left in place to empty the bladder. This may cause some discomfort or a feeling that you need to urinate. Your doctor determines the period of time that the catheter will be left in place. You may have bloody urine for two to three days (Call your doctor if the amount of bleeding increases or does not subside).  You may pass blood clots in your urine, especially if you had a biopsy. It is not unusual to pass small blood clots and have some bloody urine a couple of weeks after your cystoscopy. Again, call your doctor if the bleeding does not subside. You may have: Dysuria (painful urination) Frequency (urinating often) Urgency (strong desire to urinate)  These symptoms are common especially if medicine is instilled into the bladder or a ureteral stent is placed. Avoiding alcohol and caffeine, such as coffee, tea, and chocolate, may help relieve these symptoms. Drink plenty of water, unless otherwise instructed. Your doctor may also prescribe an antibiotic or other medicine to reduce these symptoms.  Cystoscopy results are available soon after the procedure; biopsy results usually take two to four days. Your doctor will discuss the results of your exam with you. Before you go home, you will be given specific instructions for follow-up care. Special Instructions:  1 If you are going home with a catheter in place do not take a tub bath until removed by your doctor.  2 You may resume your normal activities.  3 Do not drive or operate machinery if you are taking narcotic pain medicine.  4 Be sure to keep all follow-up appointments with your doctor.   5 Call Your Doctor If: The catheter is not draining  You have severe pain  You are unable to urinate  You have a fever over 101  You have severe bleeding           Post Anesthesia Home Care Instructions  Activity: Get plenty of rest for  the remainder of the day. A responsible individual must stay with you for 24 hours following the procedure.  For the next 24 hours, DO NOT: -Drive a car -Operate machinery -Drink alcoholic beverages -Take any medication unless instructed by your physician -Make any legal decisions or sign important papers.  Meals: Start with liquid foods such as gelatin or soup. Progress to regular foods as tolerated. Avoid greasy, spicy, heavy foods. If nausea and/or vomiting occur, drink only clear liquids until the nausea and/or vomiting subsides. Call your physician if vomiting continues.  Special Instructions/Symptoms: Your throat may feel dry or sore from the anesthesia or the breathing tube placed in your throat during surgery. If this causes discomfort, gargle with warm salt water. The discomfort should disappear within 24 hours.        

## 2018-06-17 NOTE — H&P (Signed)
HPI: Jody Murray is a 22 year-old female with a left distal ureteral stone and intermittent severe renal colic.  She has never had surgical treatment for calculi in the past.  03/10/18: When she was seen by Dr. Carmelia Roller on 01/26/18 she had reported a 2 week history of some right flank pain reminiscent of that when she had stones in the past. 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  Information from her previous urologist revealed that in 2016 CT scan and ultrasound had revealed a 3 mm stone in the lower pole of her left kidney.  24 hour urine: She was found to have a markedly low urine output of only 550 cc. 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  She came in with her mother today. We discussed her risk factors and the fact that she continues to form and pass stones. She has passed a stone recently as about a week or so ago. It has been sometime since she has had any imaging studies. She is not taking any medication to help prevent stone formation but she has increased her fluid intake significantly.   06/10/18: When I saw her last I started her on Urocit-K 15 mEq b.i.d.     CC: I have a ureteral stone.  HPI: The patient's stone was on her left side. She first noticed the symptoms 06/07/2018. This is not her first kidney stone. There is a history of calculus disease in the family. She is currently having flank pain, back pain, nausea, vomiting, fever, and chills. She denies having groin pain. She does have a burning sensation when she urinates. She has not caught a stone in her urine strainer since her symptoms began.   She has never had surgical treatment for calculi in the past.   06/10/18: She was seen in the emergency room on 06/07/18 for acute onset left flank pain with associated nausea and vomiting. A CT scan performed at that time revealed what was reported as a 5 mm distal left ureteral calculus with minimal hydronephrosis. In fact it was 5 mm in length but 3 mm  in width. It was located just above the ureterovesical junction. There were no other significant right or left renal calculi. 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 She has not had any significant pain since that episode but has not seen her stone pass. She has no voiding symptoms. She was not placed on any tamsulosin. She was given adequate pain medication.  06/17/18: She presented to the office with acute onset severe left renal colic.   ALLERGIES: None   MEDICATIONS: Urocit-K 15 meq (1,620 mg) tablet, extended release 1 tablet PO BID  Flomax  Tramadol Hcl     GU PSH: None   NON-GU PSH: Anesth, Ear Surgery, Left Foot surgery (unspecified), Left    GU PMH: Renal calculus, Left, She appears to have 2 left renal calculi. I will monitor these with serial imaging. - 03/10/2018    NON-GU PMH: Hypocitraturia, I recommended we 1st begin potassium citrate 15 mEq b.i.d.Marland Kitchen I will then have her return for repeat imaging in 6 months. We discussed adding a thiazide diuretic if she continues to demonstrate metabolic stone activity. - 03/10/2018    FAMILY HISTORY: nephrolithiasis - Father   SOCIAL HISTORY: Marital Status: Single Preferred Language: English; Ethnicity: Not Hispanic Or Latino; Race: White Current Smoking Status: Patient has never smoked.   Tobacco Use Assessment Completed: Used Tobacco in last 30 days? Has  never drank.  Drinks 1 caffeinated drink per day.    REVIEW OF SYSTEMS:    GU Review Female:   Patient denies frequent urination, hard to postpone urination, burning /pain with urination, get up at night to urinate, leakage of urine, stream starts and stops, trouble starting your stream, have to strain to urinate, and being pregnant.  Gastrointestinal (Upper):   Patient denies nausea, vomiting, and indigestion/ heartburn.  Gastrointestinal (Lower):   Patient denies diarrhea and constipation.  Constitutional:   Patient denies fever, night sweats, weight loss, and fatigue.   Skin:   Patient denies skin rash/ lesion and itching.  Eyes:   Patient denies blurred vision and double vision.  Ears/ Nose/ Throat:   Patient denies sore throat and sinus problems.  Hematologic/Lymphatic:   Patient denies swollen glands and easy bruising.  Cardiovascular:   Patient denies leg swelling and chest pains.  Respiratory:   Patient denies cough and shortness of breath.  Endocrine:   Patient denies excessive thirst.  Musculoskeletal:   Patient denies back pain and joint pain.  Neurological:   Patient denies headaches and dizziness.  Psychologic:   Patient denies depression and anxiety.   VITAL SIGNS:    Weight 108 lb / 48.99 kg  Height 63 in / 160.02 cm  BP 97/58 mmHg  Pulse 72 /min  BMI 19.1 kg/m   MULTI-SYSTEM PHYSICAL EXAMINATION:    Constitutional: Well-nourished and thin. No physical deformities. Normally developed. Good grooming.  In severe distress. Neck: Neck symmetrical, not swollen. Normal tracheal position.  Respiratory: No labored breathing, no use of accessory muscles. Normal breath sounds.  Cardiovascular: Normal temperature, normal extremity pulses, no swelling, no varicosities.   Lymphatic: No enlargement of neck, axillae, groin.  Skin: No paleness, no jaundice, no cyanosis. No lesion, no ulcer, no rash.  Neurologic / Psychiatric: Oriented to time, oriented to place, oriented to person. No depression, no anxiety, no agitation.  Gastrointestinal: No mass, no tenderness, no rigidity, non obese abdomen.  Eyes: Normal conjunctivae. Normal eyelids.  Ears, Nose, Mouth, and Throat: Left ear no scars, no lesions, no masses. Right ear no scars, no lesions, no masses. Nose no scars, no lesions, no masses. Normal hearing. Normal lips.  Musculoskeletal: Normal gait and station of head and neck.    PAST DATA REVIEWED:  Source Of History:  Patient  Records Review:   Previous Hospital Records, Previous Patient Records, POC Tool  Urine Test Review:   Urinalysis  X-Ray  Review: C.T. Stone Protocol: Reviewed Films. Reviewed Report.    Notes:                     Her urinalysis had no sign of infection with 0-5 RBCs and 0-5 WBCs.   PROCEDURES:         KUB - [id number]  A single view of the abdomen is obtained.      Review of her KUB reveals the stone in the distal left ureter appears unchanged in position.         Urinalysis Dipstick Dipstick Cont'd  Color: Yellow Bilirubin: Neg mg/dL  Appearance: Clear Ketones: Neg mg/dL  Specific Gravity: 1.610 Blood: Neg ery/uL  pH: 6.0 Protein: Neg mg/dL  Glucose: Neg mg/dL Urobilinogen: 0.2 mg/dL    Nitrites: Neg    Leukocyte Esterase: Neg leu/uL    ASSESSMENT/PLAN:      ICD-10 Details  1 GU:   Renal calculus - N20.0 Her renal calculi are all now mm to less than  1 mm in size. It appears the Urocit-K is working at preventing further stone formation  2   Ureteral calculus - N20.1 Left, Acute - Her left distal ureteral stone is 3-4 mm in width. She has always passed her stones. We did discuss treatment options if her stone does not spontaneously pass.  I had placed her on tamsulosin for medical expulsive therapy however she return to the office today in severe pain (10/10) in the left flank.  Her pain was controlled with morphine but my concern was that this would likely recur and therefore have recommended treatment of her stone with ureteroscopy.  This will be performed today.

## 2018-06-17 NOTE — Transfer of Care (Signed)
Immediate Anesthesia Transfer of Care Note  Patient: Jody Murray  Procedure(s) Performed: LEFT URETEROSCOPY WITH BASKET EXTRACTION OF LEFT URETERAL STONE (Left Ureter)  Patient Location: PACU  Anesthesia Type:General  Level of Consciousness: sedated and responds to stimulation  Airway & Oxygen Therapy: Patient Spontanous Breathing and Patient connected to nasal cannula oxygen  Post-op Assessment: Report given to RN  Post vital signs: Reviewed and stable  Last Vitals:  Vitals Value Taken Time  BP 109/65 06/17/2018  1:10 PM  Temp    Pulse 60 06/17/2018  1:11 PM  Resp 13 06/17/2018  1:11 PM  SpO2 100 % 06/17/2018  1:11 PM  Vitals shown include unvalidated device data.  Last Pain:  Vitals:   06/17/18 1153  TempSrc: Oral      Patients Stated Pain Goal: 5 (06/17/18 1213)  Complications: No apparent anesthesia complications

## 2018-06-17 NOTE — Anesthesia Procedure Notes (Signed)
Procedure Name: LMA Insertion Date/Time: 06/17/2018 12:29 PM Performed by: Briant Sitesenenny, Jaque Dacy T, CRNA Pre-anesthesia Checklist: Patient identified, Emergency Drugs available, Suction available and Patient being monitored Patient Re-evaluated:Patient Re-evaluated prior to induction Oxygen Delivery Method: Circle system utilized Preoxygenation: Pre-oxygenation with 100% oxygen Induction Type: IV induction Ventilation: Mask ventilation without difficulty LMA: LMA inserted LMA Size: 4.0 Number of attempts: 1 Airway Equipment and Method: Bite block Placement Confirmation: positive ETCO2 Tube secured with: Tape Dental Injury: Teeth and Oropharynx as per pre-operative assessment

## 2018-06-17 NOTE — Anesthesia Preprocedure Evaluation (Addendum)
Anesthesia Evaluation  Patient identified by MRN, date of birth, ID band Patient awake    Reviewed: Allergy & Precautions, NPO status , Patient's Chart, lab work & pertinent test results  Airway Mallampati: III  TM Distance: >3 FB Neck ROM: Full  Mouth opening: Limited Mouth Opening  Dental  (+) Teeth Intact, Dental Advisory Given   Pulmonary neg pulmonary ROS,    breath sounds clear to auscultation       Cardiovascular negative cardio ROS   Rhythm:Regular Rate:Normal     Neuro/Psych negative neurological ROS     GI/Hepatic negative GI ROS, Neg liver ROS,   Endo/Other  negative endocrine ROS  Renal/GU negative Renal ROS     Musculoskeletal negative musculoskeletal ROS (+)   Abdominal Normal abdominal exam  (+)   Peds  Hematology negative hematology ROS (+)   Anesthesia Other Findings Limited mouth opening likely due to effort  Reproductive/Obstetrics                           Anesthesia Physical Anesthesia Plan  ASA: I  Anesthesia Plan: General   Post-op Pain Management:    Induction: Intravenous  PONV Risk Score and Plan: 4 or greater and Ondansetron, Dexamethasone, Scopolamine patch - Pre-op and Midazolam  Airway Management Planned: LMA  Additional Equipment: None  Intra-op Plan:   Post-operative Plan: Extubation in OR  Informed Consent: I have reviewed the patients History and Physical, chart, labs and discussed the procedure including the risks, benefits and alternatives for the proposed anesthesia with the patient or authorized representative who has indicated his/her understanding and acceptance.   Dental advisory given  Plan Discussed with: CRNA  Anesthesia Plan Comments: (Pt denies sexual activity since negative pregnancy test on 06/07/2018.)      Anesthesia Quick Evaluation

## 2018-06-17 NOTE — Op Note (Signed)
PATIENT:  Jody Murray  PRE-OPERATIVE DIAGNOSIS:  left Ureteral calculus  POST-OPERATIVE DIAGNOSIS: Same  PROCEDURE:  1. Left ureteroscopy 2. Left ureteral stone extraction  SURGEON: Garnett FarmMark C Brogan Martis, MD  INDICATION: Jody Murray is a 22 year old female with a history of calculus disease.  She was recently diagnosed with a left distal ureteral stone and had passed all of her stones previously therefore we attempted medical expulsive therapy by placing her on an alpha-blocker however she returned to the office today in severe pain requiring parenteral narcotic analgesics for pain control.  We discussed proceeding with ureteroscopic management of her stone and she is elected to proceed.  ANESTHESIA:  General  EBL:  Minimal  DRAINS: None  SPECIMEN: Stone given to patient  DESCRIPTION OF PROCEDURE: The patient was taken to the major OR and placed on the table. General anesthesia was administered and then the patient was moved to the dorsal lithotomy position. The genitalia was sterilely prepped and draped. An official timeout was performed.  Initially the 23 French cystoscope with 30 lens was passed under direct vision into the bladder. The bladder was then fully inspected. It was noted be free of any tumors, stones or inflammatory lesions. Ureteral orifices were of normal configuration and position. A 6 French open-ended ureteral catheter was then passed through the cystoscope into the ureteral orifice.  I then passed a 0.038 inch sensor guidewire through the open-ended catheter and up the left ureter under fluoroscopy.  This was left in place and the cystoscope as well as open-ended catheter were removed.  I passed the inner portion of a ureteral access sheath over the guidewire and through the intramural ureter which dilated quite easily with no resistance.  I therefore left the guidewire in place and removed the access sheath trocar.  A 6 French rigid ureteroscope was then passed under  direct into the bladder and into the left orifice and up the ureter. The stone was identified and I felt it was small enough that I could easily extracted without the need for laser lithotripsy.  I therefore passed a nitinol basket through the ureteroscope and engaged the stone.  I then pulled the stone against the beak of the ureteroscope and slowly backed the ureteroscope with the stone in the basket out of the ureter and this was extracted easily.  I then removed the guidewire and the bladder was drained.  The patient tolerated the procedure well no intraoperative complications.  PLAN OF CARE: Discharge to home after PACU  PATIENT DISPOSITION:  PACU - hemodynamically stable.

## 2018-06-20 NOTE — Anesthesia Postprocedure Evaluation (Signed)
Anesthesia Post Note  Patient: Jody Murray  Procedure(s) Performed: LEFT URETEROSCOPY WITH BASKET EXTRACTION OF LEFT URETERAL STONE (Left Ureter)     Patient location during evaluation: PACU Anesthesia Type: General Level of consciousness: awake and alert Pain management: pain level controlled Vital Signs Assessment: post-procedure vital signs reviewed and stable Respiratory status: spontaneous breathing, nonlabored ventilation, respiratory function stable and patient connected to nasal cannula oxygen Cardiovascular status: blood pressure returned to baseline and stable Postop Assessment: no apparent nausea or vomiting Anesthetic complications: no    Last Vitals:  Vitals:   06/17/18 1430 06/17/18 1515  BP: 118/78 118/78  Pulse: 62 64  Resp: 11 16  Temp:  36.9 C  SpO2: 100% 100%    Last Pain:  Vitals:   06/17/18 1515  TempSrc:   PainSc: 3                  Shelton Silvas

## 2018-06-21 ENCOUNTER — Encounter (HOSPITAL_BASED_OUTPATIENT_CLINIC_OR_DEPARTMENT_OTHER): Payer: Self-pay | Admitting: Urology

## 2018-06-24 ENCOUNTER — Encounter: Payer: Self-pay | Admitting: Family Medicine

## 2018-06-30 ENCOUNTER — Encounter: Payer: Self-pay | Admitting: Family Medicine

## 2018-06-30 ENCOUNTER — Ambulatory Visit (INDEPENDENT_AMBULATORY_CARE_PROVIDER_SITE_OTHER): Payer: Commercial Managed Care - PPO | Admitting: Family Medicine

## 2018-06-30 VITALS — BP 98/62 | HR 95 | Temp 98.6°F | Ht 63.0 in | Wt 105.0 lb

## 2018-06-30 DIAGNOSIS — J302 Other seasonal allergic rhinitis: Secondary | ICD-10-CM | POA: Diagnosis not present

## 2018-06-30 DIAGNOSIS — Z23 Encounter for immunization: Secondary | ICD-10-CM | POA: Diagnosis not present

## 2018-06-30 NOTE — Addendum Note (Signed)
Addended by: Scharlene GlossEWING, Gael Londo B on: 06/30/2018 01:40 PM   Modules accepted: Orders

## 2018-06-30 NOTE — Progress Notes (Signed)
Chief Complaint  Patient presents with  . Follow-up    Subjective: Patient is a 22 y.o. female here for ST.   Had some URI s/s's at end of July, has improved. Does have allergies and is having some clear phlegm. 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 Takes loratadine that is helpful. No fevers.  Past Medical History:  Diagnosis Date  . Complication of anesthesia    PONV, takes a little longer waking up  . History of chicken pox   . History of kidney stones   . Pneumonia    age 22    Objective: BP 98/62 (BP Location: Left Arm, Patient Position: Sitting, Cuff Size: Normal)   Pulse 95   Temp 98.6 F (37 C) (Oral)   Ht 5\' 3"  (1.6 m)   Wt 105 lb (47.6 kg)   LMP 06/01/2018 (Approximate)   SpO2 95%   BMI 18.60 kg/m  General: Awake, appears stated age HEENT: MMM, EOMi, throat neg, ears neg, nares neg. Heart: RRR, no murmurs Lungs: CTAB, no rales, wheezes or rhonchi. No accessory muscle use Psych: Age appropriate judgment and insight, normal affect and mood  Assessment and Plan: Seasonal allergies  Gave OTC options for allergies. F/u for CPE in around 7 mo or prn. The patient voiced understanding and agreement to the plan.  Jilda Rocheicholas Paul Kean UniversityWendling, DO 06/30/18  1:36 PM

## 2018-06-30 NOTE — Patient Instructions (Signed)
Claritin (loratadine), Allegra (fexofenadine), Zyrtec (cetirizine); these are listed in order from weakest to strongest. Generic, and therefore cheaper, options are in the parentheses.   Flonase (fluticasone); nasal spray that is over the counter. 2 sprays each nostril, once daily. Aim towards the same side eye when you spray.  There are available OTC, and the generic versions, which may be cheaper, are in parentheses. Show this to a pharmacist if you have trouble finding any of these items.  Let us know if you need anything.  

## 2018-06-30 NOTE — Progress Notes (Signed)
Pre visit review using our clinic review tool, if applicable. No additional management support is needed unless otherwise documented below in the visit note. 

## 2019-07-28 ENCOUNTER — Other Ambulatory Visit: Payer: Self-pay

## 2019-07-28 ENCOUNTER — Ambulatory Visit (INDEPENDENT_AMBULATORY_CARE_PROVIDER_SITE_OTHER): Payer: Commercial Managed Care - PPO | Admitting: *Deleted

## 2019-07-28 DIAGNOSIS — Z23 Encounter for immunization: Secondary | ICD-10-CM | POA: Diagnosis not present

## 2019-07-28 NOTE — Progress Notes (Signed)
Patient her today for flu vaccine.  Vaccine given in left deltoid and patient tolerated well.   

## 2020-03-05 ENCOUNTER — Telehealth: Payer: Self-pay | Admitting: Family Medicine

## 2020-03-05 NOTE — Telephone Encounter (Signed)
LVM for pt to call office to schedule an appt with another provider since Carmelia Roller is not in office this wk, if pt is ok with it.

## 2020-03-13 ENCOUNTER — Other Ambulatory Visit: Payer: Self-pay

## 2020-03-13 ENCOUNTER — Ambulatory Visit: Payer: Commercial Managed Care - PPO | Admitting: Family Medicine

## 2020-03-13 ENCOUNTER — Encounter: Payer: Self-pay | Admitting: Family Medicine

## 2020-03-13 VITALS — BP 98/68 | HR 59 | Temp 96.2°F | Ht 63.0 in | Wt 104.1 lb

## 2020-03-13 DIAGNOSIS — T148XXA Other injury of unspecified body region, initial encounter: Secondary | ICD-10-CM | POA: Diagnosis not present

## 2020-03-13 NOTE — Progress Notes (Signed)
Musculoskeletal Exam  Patient: Jody Murray DOB: 09-20-1996  DOS: 03/13/2020  SUBJECTIVE:  Chief Complaint:   Chief Complaint  Patient presents with  . Shoulder Pain    right shoulder pain for 3 years    Jody Murray is a 24 y.o.  female for evaluation and treatment of R shoulder pain. She is R handed. Here w father who helps w hx at times.  Onset:  4 years ago. No inj or change in activity.  Location: R upper back/shoulder area Character:  aching  Progression of issue:  has worsened Associated symptoms: no bruising, redness, swelling, decreased ROM Treatment: to date has been OTC NSAIDS and massage.   Neurovascular symptoms: no  Past Medical History:  Diagnosis Date  . Complication of anesthesia    PONV, takes a little longer waking up  . History of chicken pox   . History of kidney stones   . Pneumonia    age 46    Objective: VITAL SIGNS: BP 98/68 (BP Location: Left Arm, Patient Position: Sitting, Cuff Size: Normal)   Pulse (!) 59   Temp (!) 96.2 F (35.7 C) (Temporal)   Ht 5\' 3"  (1.6 m)   Wt 104 lb 2 oz (47.2 kg)   SpO2 100%   BMI 18.44 kg/m  Constitutional: Well formed, well developed. No acute distress. Cardiovascular: Brisk cap refill Thorax & Lungs: No accessory muscle use Musculoskeletal: R upper back.   Normal active range of motion: yes of shoulder.   Tenderness to palpation: yes; there is a ropy and tender muscle over the R upper back area over trap Deformity: no Ecchymosis: no Neurologic: Normal sensory function. Psychiatric: Normal mood. Age appropriate judgment and insight. Alert & oriented x 3.    Assessment:  Muscle strain  Plan: NSAIDs, Tylenol, stretches/exercises, heat. Deep tissue massage rec'd via MyChart.  F/u prn. If no improvement, will consider trigger pt injections vs PT referral.  The patient and her father voiced understanding and agreement to the plan.   [place], DO 03/13/20  1:45 PM

## 2020-03-13 NOTE — Patient Instructions (Signed)
Heat (pad or rice pillow in microwave) over affected area, 10-15 minutes twice daily.   OK to use Meloxicam 15 mg daily.   I will send you a message regarding a deep tissue masseuse.   Mid-Back Strain Rehab It is normal to feel mild stretching, pulling, tightness, or discomfort as you do these exercises, but you should stop right away if you feel sudden pain or your pain gets worse.  Stretching and range of motion exercises This exercise warms up your muscles and joints and improves the movement and flexibility of your back and shoulders. This exercise also help to relieve pain. Exercise A: Chest and spine stretch  1. Lie down on your back on a firm surface. 2. Roll a towel or a small blanket so it is about 4 inches (10 cm) in diameter. 3. Put the towel lengthwise under the middle of your back so it is under your spine, but not under your shoulder blades. 4. To increase the stretch, you may put your hands behind your head and let your elbows fall to your sides. 5. Hold for 30 seconds. Repeat exercise 2 times. Complete this exercise 3 times per week. Strengthening exercises These exercises build strength and endurance in your back and your shoulder blade muscles. Endurance is the ability to use your muscles for a long time, even after they get tired. Exercise C: Straight arm rows (shoulder extension) 1. Stand with your feet shoulder width apart. 2. Secure an exercise band to a stable object in front of you so the band is at or above shoulder height. 3. Hold one end of the exercise band in each hand. 4. Straighten your elbows and lift your hands up to shoulder height. 5. Step back, away from the secured end of the exercise band, until the band stretches. 6. Squeeze your shoulder blades together and pull your hands down to the sides of your thighs. Stop when your hands are straight down by your sides. Do not let your hands go behind your body. 7. Hold for 2 seconds. 8. Slowly return to the  starting position. Repeat 2 times. Complete this exercise 3 times per week. Exercise D: Shoulder external rotation, prone 1. Lie on your abdomen on a firm bed so your left / right forearm hangs over the edge of the bed and your upper arm is on the bed, straight out from your body. ? Your elbow should be bent. ? Your palm should be facing your feet. 2. If instructed, hold a 2-5 lb weight in your hand. 3. Squeeze your shoulder blade toward the middle of your back. Do not let your shoulder lift toward your ear. 4. Keep your elbow bent in an "L" shape (90 degrees) while you slowly move your forearm up toward the ceiling. Move your forearm up to the height of the bed, toward your head. ? Your upper arm should not move. ? At the top of the movement, your palm should face the floor. 5. Hold for 1 second. 6. Slowly return to the starting position and relax your muscles. Repeat 2 times. Complete this exercise 3 times per week. Exercise E: Scapular retraction and external rotation, rowing  1. Sit in a stable chair without armrests, or stand. 2. Secure an exercise band to a stable object in front of you so it is at shoulder height. 3. Hold one end of the exercise band in each hand. 4. Bring your arms out straight in front of you. 5. Step back, away from the  secured end of the exercise band, until the band stretches. 6. Pull the band backward. As you do this, bend your elbows and squeeze your shoulder blades together, but avoid letting the rest of your body move. Do not let your shoulders lift up toward your ears. 7. Stop when your elbows are at your sides or slightly behind your body. 8. Hold for 1 second1. 9. Slowly straighten your arms to return to the starting position. Repeat 2 times. Complete this exercise 3 times per week. Posture and body mechanics  Body mechanics refers to the movements and positions of your body while you do your daily activities. Posture is part of body mechanics. Good  posture and healthy body mechanics can help to relieve stress in your body's tissues and joints. Good posture means that your spine is in its natural S-curve position (your spine is neutral), your shoulders are pulled back slightly, and your head is not tipped forward. The following are general guidelines for applying improved posture and body mechanics to your everyday activities. Standing   When standing, keep your spine neutral and your feet about hip-width apart. Keep a slight bend in your knees. Your ears, shoulders, and hips should line up.  When you do a task in which you lean forward while standing in one place for a long time, place one foot up on a stable object that is 2-4 inches (5-10 cm) high, such as a footstool. This helps keep your spine neutral. Sitting   When sitting, keep your spine neutral and keep your feet flat on the floor. Use a footrest, if necessary, and keep your thighs parallel to the floor. Avoid rounding your shoulders, and avoid tilting your head forward.  When working at a desk or a computer, keep your desk at a height where your hands are slightly lower than your elbows. Slide your chair under your desk so you are close enough to maintain good posture.  When working at a computer, place your monitor at a height where you are looking straight ahead and you do not have to tilt your head forward or downward to look at the screen. Resting  When lying down and resting, avoid positions that are most painful for you.  If you have pain with activities such as sitting, bending, stooping, or squatting (flexion-based activities), lie in a position in which your body does not bend very much. For example, avoid curling up on your side with your arms and knees near your chest (fetal position).  If you have pain with activities such as standing for a long time or reaching with your arms (extension-based activities), lie with your spine in a neutral position and bend your knees  slightly. Try the following positions:  Lying on your side with a pillow between your knees.  Lying on your back with a pillow under your knees.  Lifting   When lifting objects, keep your feet at least shoulder-width apart and tighten your abdominal muscles.  Bend your knees and hips and keep your spine neutral. It is important to lift using the strength of your legs, not your back. Do not lock your knees straight out.  Always ask for help to lift heavy or awkward objects. Make sure you discuss any questions you have with your health care provider. Document Released: 10/05/2005 Document Revised: 06/11/2016 Document Reviewed: 07/17/2015 Elsevier Interactive Patient Education  [employer].

## 2020-06-02 IMAGING — CT CT RENAL STONE PROTOCOL
2 of 4 series · 16 of 46 positions shown, 18 images · non-contrast
Comparison: None.

CLINICAL DATA: Acute left flank pain.

EXAM:
CT ABDOMEN AND PELVIS WITHOUT CONTRAST
TECHNIQUE: Multidetector CT imaging of the abdomen and pelvis was performed
following the standard protocol without IV contrast.

[Series 2: axial st · axial · 0.75mm/px · z∈[-466,-71]mm · 13 of 87 slices shown, 15 images]
[im 4/87  soft-tissue]
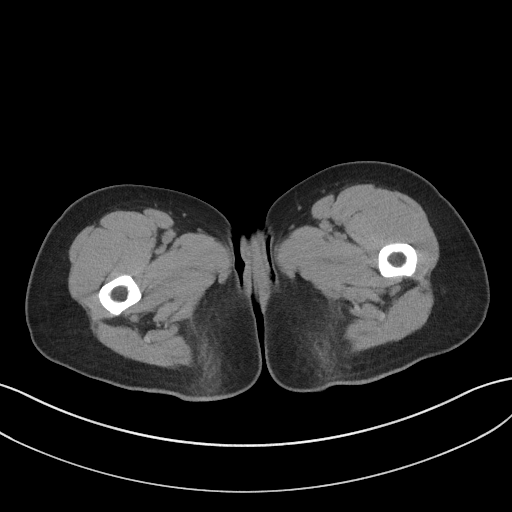
[im 4/87  bone]
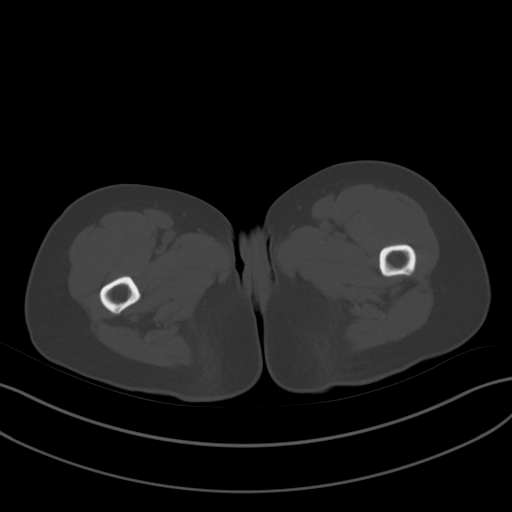
[im 11/87  soft-tissue]
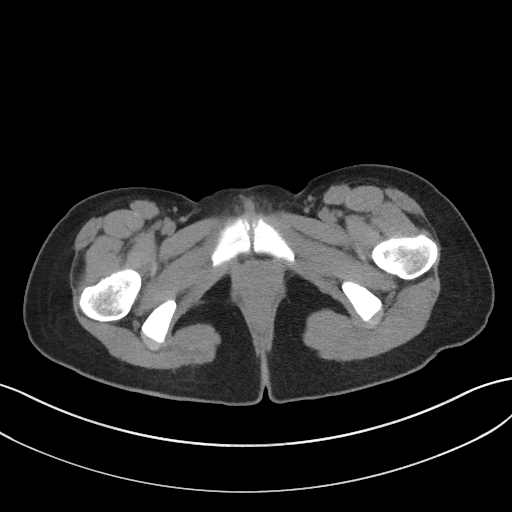
[im 18/87  soft-tissue]
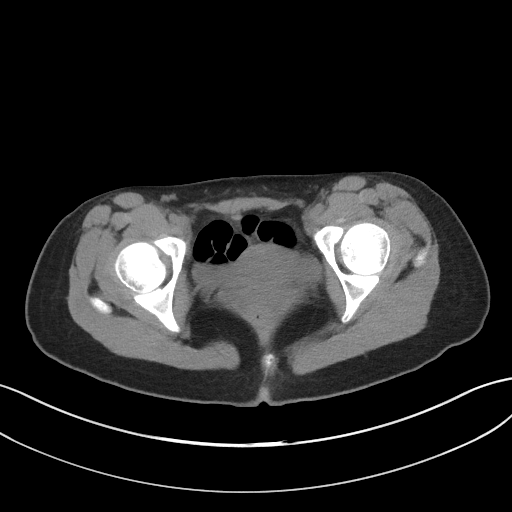
[im 25/87  soft-tissue]
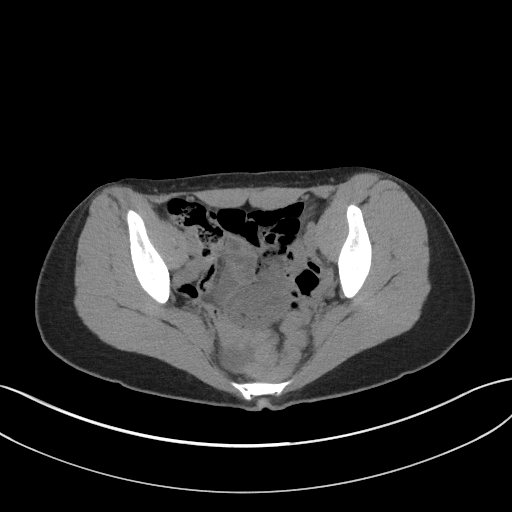
[im 31/87  soft-tissue]
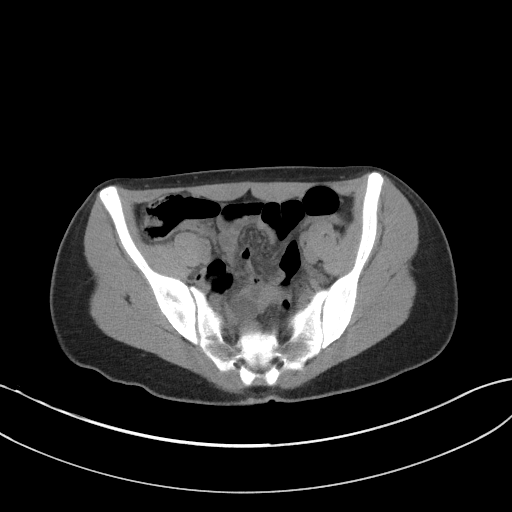
[im 38/87  soft-tissue]
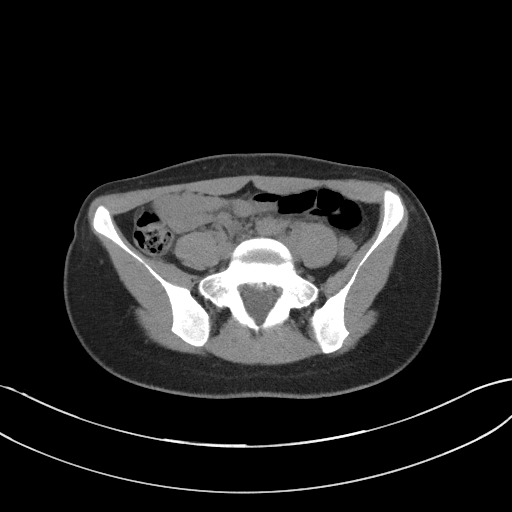
[im 45/87  soft-tissue]
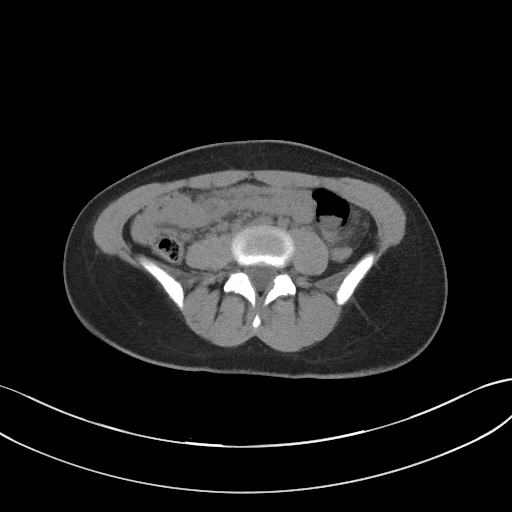
[im 49/87  soft-tissue]
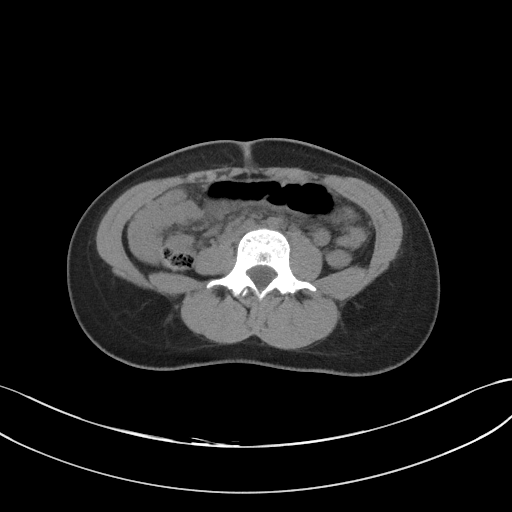
[im 56/87  soft-tissue]
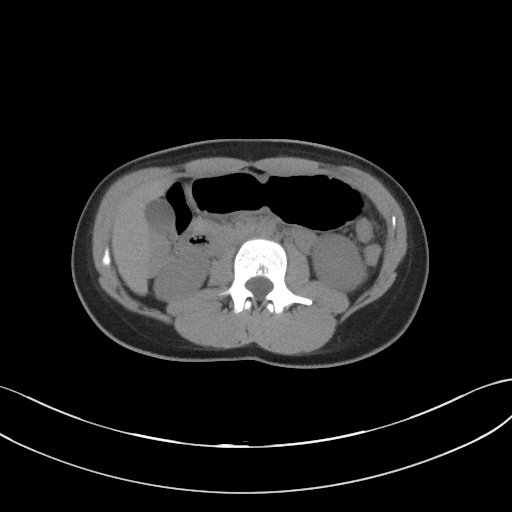
[im 56/87  bone]
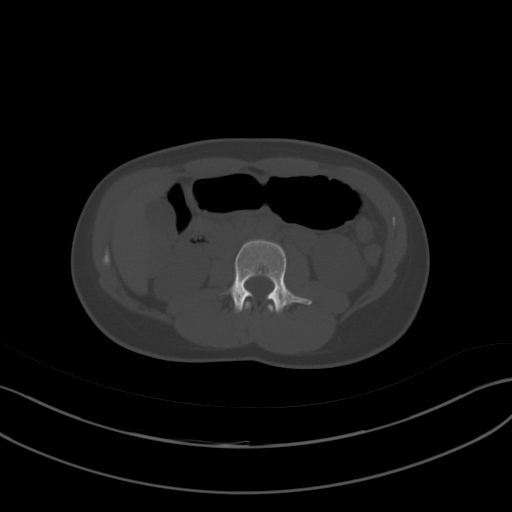
[im 62/87  soft-tissue]
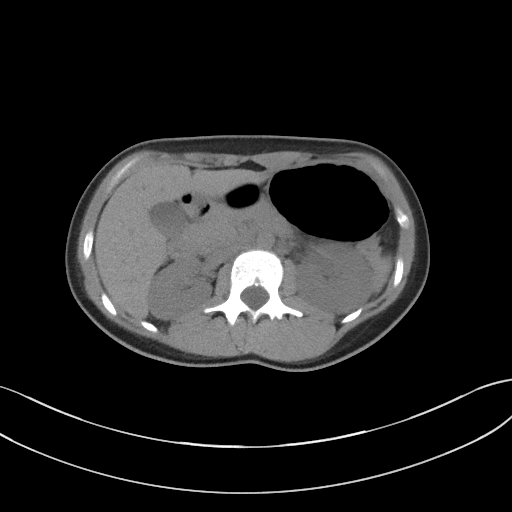
[im 69/87  soft-tissue]
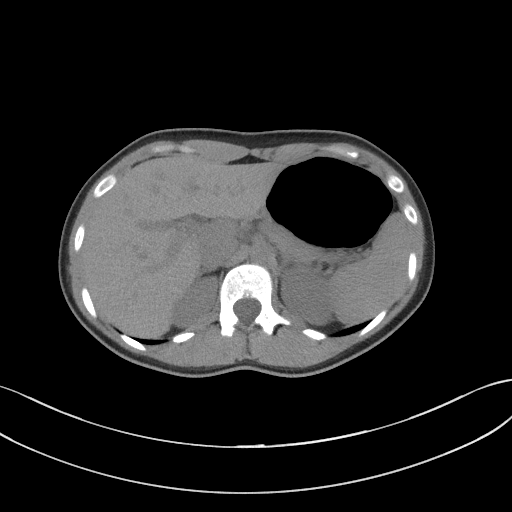
[im 76/87  soft-tissue]
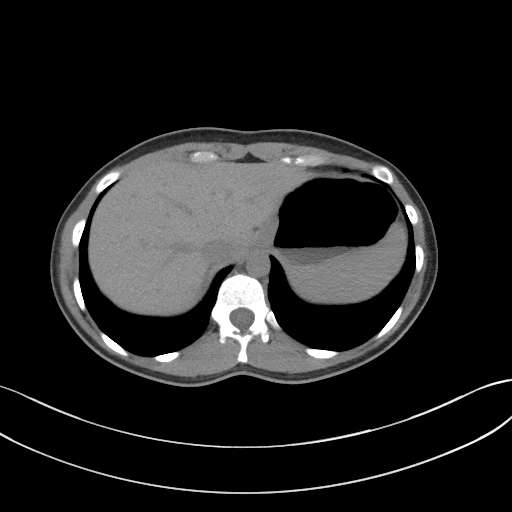
[im 83/87  soft-tissue]
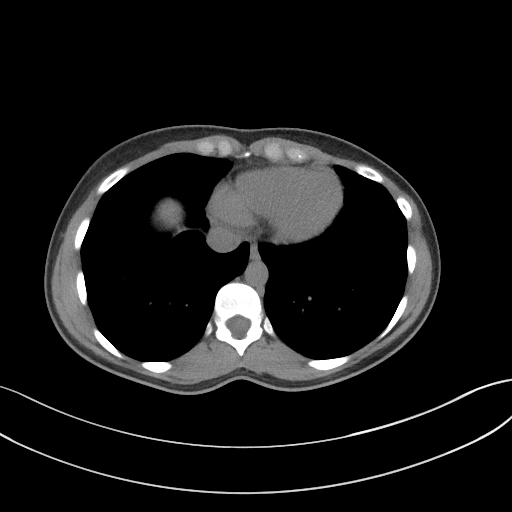

[Series 4: coronal st · coronal · 0.66mm/px · 3 of 63 slices shown]
[im 21/63  soft-tissue]
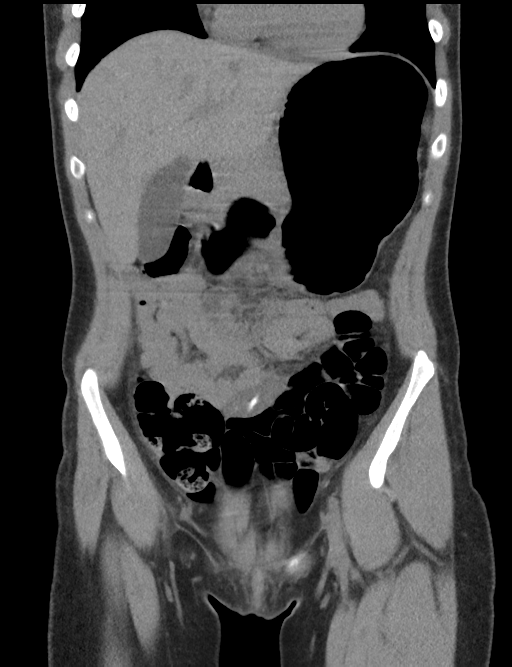
[im 28/63  soft-tissue]
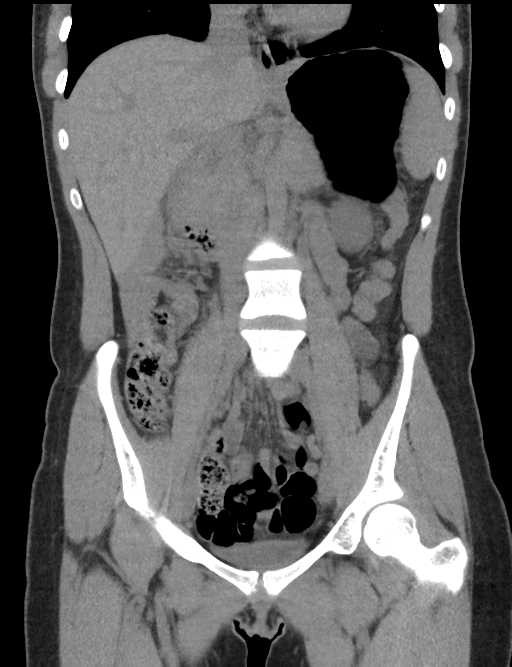
[im 35/63  soft-tissue]
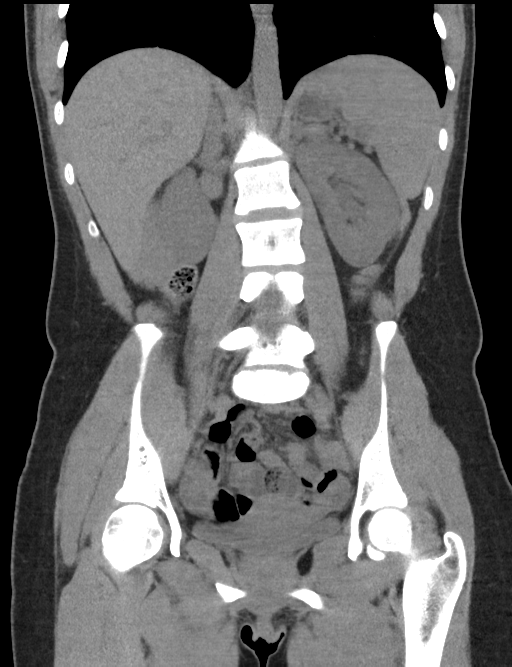

[16 of 46 positions shown; findings below may reference images not displayed]

FINDINGS: Lower chest: No acute abnormality.

Hepatobiliary: No focal liver abnormality is seen. No gallstones,
gallbladder wall thickening, or biliary dilatation.

Pancreas: Unremarkable. No pancreatic ductal dilatation or
surrounding inflammatory changes.

Spleen: Normal in size without focal abnormality.

Adrenals/Urinary Tract: Adrenal glands appear normal. Minimal right
nephrolithiasis is noted. Minimal left hydroureteronephrosis is
noted secondary to 5 mm calculus in distal left ureter. Urinary
bladder is unremarkable.

Stomach/Bowel: Stomach is within normal limits. Appendix appears
normal. No evidence of bowel wall thickening, distention, or
inflammatory changes.

Vascular/Lymphatic: No significant vascular findings are present. No
enlarged abdominal or pelvic lymph nodes.

Reproductive: Uterus and bilateral adnexa are unremarkable.

Other: No abdominal wall hernia or abnormality. No abdominopelvic
ascites.

Musculoskeletal: No acute or significant osseous findings.
IMPRESSION: Minimal right nephrolithiasis. Minimal left hydroureteronephrosis is
noted secondary to 5 mm distal left ureteral calculus.

## 2020-09-11 ENCOUNTER — Ambulatory Visit: Payer: Commercial Managed Care - PPO

## 2021-05-20 ENCOUNTER — Encounter: Payer: Self-pay | Admitting: Family Medicine

## 2021-05-20 ENCOUNTER — Other Ambulatory Visit: Payer: Self-pay

## 2021-05-20 ENCOUNTER — Ambulatory Visit: Payer: Commercial Managed Care - PPO | Admitting: Family Medicine

## 2021-05-20 VITALS — BP 102/68 | HR 60 | Temp 98.1°F | Ht 62.0 in | Wt 106.5 lb

## 2021-05-20 DIAGNOSIS — D72829 Elevated white blood cell count, unspecified: Secondary | ICD-10-CM

## 2021-05-20 DIAGNOSIS — R1031 Right lower quadrant pain: Secondary | ICD-10-CM

## 2021-05-20 DIAGNOSIS — L905 Scar conditions and fibrosis of skin: Secondary | ICD-10-CM

## 2021-05-20 DIAGNOSIS — E876 Hypokalemia: Secondary | ICD-10-CM | POA: Diagnosis not present

## 2021-05-20 LAB — BASIC METABOLIC PANEL
BUN: 10 mg/dL (ref 6–23)
CO2: 25 mEq/L (ref 19–32)
Calcium: 10 mg/dL (ref 8.4–10.5)
Chloride: 103 mEq/L (ref 96–112)
Creatinine, Ser: 0.58 mg/dL (ref 0.40–1.20)
GFR: 126.24 mL/min (ref >60.00)
Glucose, Bld: 92 mg/dL (ref 70–99)
Potassium: 4.5 mEq/L (ref 3.5–5.1)
Sodium: 139 mEq/L (ref 135–145)

## 2021-05-20 LAB — CBC
HCT: 37.7 % (ref 36.0–46.0)
Hemoglobin: 12.7 g/dL (ref 12.0–15.0)
MCHC: 33.7 g/dL (ref 30.0–36.0)
MCV: 99.3 fl (ref 78.0–100.0)
Platelets: 231 10*3/uL (ref 150.0–400.0)
RBC: 3.8 Mil/uL — ABNORMAL LOW (ref 3.87–5.11)
RDW: 12.6 % (ref 11.5–15.5)
WBC: 5.4 10*3/uL (ref 4.0–10.5)

## 2021-05-20 NOTE — Progress Notes (Signed)
Chief Complaint  Patient presents with   Abdominal Pain    Bump on hand due to a dog bite    Jody Murray is a 25 y.o. female here for abdominal pain and a skin complaint.  She is here with her mother.  Abd pain A little over a week ago, she was having central abd pain that was quite intense.  She had associated subjective fever and nausea/vomiting.  No bowel changes.  Went to the emergency department where she had labs done but left before being seen.  She took her father's hydrocodone and was able to fall asleep.  She has had very mild pain since that time.  No nausea or vomiting currently, bowel movements have been unremarkable.  Skin Duration: 7 weeks, a dog bit her hand and she has a bump Tdap was <5 yrs ago.  Location: R hand Pruritic? No Painful? Yes Drainage? No New soaps/lotions/topicals/detergents? No Sick contacts? No Other associated symptoms: no fevers, spreading redness Therapies tried thus far: None  Past Medical History:  Diagnosis Date   Complication of anesthesia    PONV, takes a little longer waking up   History of chicken pox    History of kidney stones    Pneumonia    age 22    BP 102/68   Pulse 60   Temp 98.1 F (36.7 C) (Oral)   Ht 5\' 2"  (1.575 m)   Wt 106 lb 8 oz (48.3 kg)   SpO2 99%   BMI 19.48 kg/m  Gen: awake, alert, appearing stated age Lungs: CTAB. No accessory muscle use Heart: RRR Abd: Bowel sounds present, soft, nondistended, no masses or organomegaly; tender in the right lower quadrant region with a positive McBurney sign; negative Murphy's, Rovsing's, Carnett's Skin: On the left thenar eminence there is a raised area of soft tissue purplish in color.  There is no tenderness to palpation, fluctuance, erythema, excessive warmth, or scaling. Psych: Age appropriate judgment and insight  Right lower quadrant pain - Plan: CT Abdomen Pelvis W Contrast  Hypokalemia - Plan: Basic metabolic panel  Leukocytosis, unspecified type - Plan:  CBC  Scar - Plan: Ambulatory referral to Dermatology  New problem, uncertain prognosis.  Check CT for possible lingering appendicitis. Follow-up on labs. Check CBC, CT is normal, acute infection more likely. Refer to Derm F/u prn. The patient and her mother voiced understanding and agreement to the plan.  [place], DO 05/20/21 11:02 AM

## 2021-05-20 NOTE — Patient Instructions (Addendum)
Give [place] 2-3 business days to get the results of your labs back.   Someone will reach out regarding the CT scan.   I recommend getting the flu shot in mid October. This suggestion would change if the CDC comes out with a different recommendation.   Let us know if you need anything.

## 2021-05-22 ENCOUNTER — Ambulatory Visit (HOSPITAL_BASED_OUTPATIENT_CLINIC_OR_DEPARTMENT_OTHER)
Admission: RE | Admit: 2021-05-22 | Discharge: 2021-05-22 | Disposition: A | Discharge status: Home / Self Care | Payer: Commercial Managed Care - PPO | Source: Ambulatory Visit | Attending: Family Medicine | Admitting: Family Medicine

## 2021-05-22 ENCOUNTER — Other Ambulatory Visit: Payer: Self-pay

## 2021-05-22 ENCOUNTER — Encounter (HOSPITAL_BASED_OUTPATIENT_CLINIC_OR_DEPARTMENT_OTHER): Payer: Self-pay

## 2021-05-22 DIAGNOSIS — R1031 Right lower quadrant pain: Secondary | ICD-10-CM | POA: Diagnosis not present

## 2021-05-22 MED ORDER — IOHEXOL 300 MG/ML  SOLN
100.0000 mL | Freq: Once | INTRAMUSCULAR | Status: AC | PRN
  Administered 2021-05-22: 100 mL via INTRAVENOUS

## 2022-05-15 ENCOUNTER — Encounter: Payer: Self-pay | Admitting: Certified Nurse Midwife

## 2022-05-15 ENCOUNTER — Ambulatory Visit (INDEPENDENT_AMBULATORY_CARE_PROVIDER_SITE_OTHER): Payer: Commercial Managed Care - PPO | Admitting: Certified Nurse Midwife

## 2022-05-15 VITALS — BP 109/66 | HR 65 | Ht 63.0 in | Wt 108.0 lb

## 2022-05-15 DIAGNOSIS — N946 Dysmenorrhea, unspecified: Secondary | ICD-10-CM | POA: Diagnosis not present

## 2022-05-15 MED ORDER — LEVONORGEST-ETH ESTRAD 91-DAY 0.15-0.03 MG PO TABS: 1.0000 | ORAL_TABLET | Freq: Every day | ORAL | 1 refills | Status: DC

## 2022-05-15 NOTE — Progress Notes (Signed)
   GYNECOLOGY OFFICE VISIT NOTE  History:  26 y.o. G0P0000 here today to discuss medical management of periods. States she is traveling to [place] with her father in early September and doesn't want to have a period during that time. Reports increased pain with periods for which she uses Ibuprofen. Periods last 5-6 days with the first 2 days the heaviest, uses 2-3 pads on these days. Denies current sexual activity, reports 1 episode many years ago. Has never had pap screening and does not want to.   Past Medical History:  Diagnosis Date   Complication of anesthesia    PONV, takes a little longer waking up   Congenital deafness    Rt ear   History of chicken pox    History of kidney stones    Pneumonia    age 28    Past Surgical History:  Procedure Laterality Date   EXTERNAL EAR SURGERY Right    FOOT SURGERY Left    History of kidney stones     URETEROSCOPY WITH HOLMIUM LASER LITHOTRIPSY Left 06/17/2018   Procedure: LEFT URETEROSCOPY WITH BASKET EXTRACTION OF LEFT URETERAL STONE;  Surgeon: Ihor Gully, MD;  Location: [facility];  Service: Urology;  Laterality: Left;   WISDOM TOOTH EXTRACTION      The following portions of the patient's history were reviewed and updated as appropriate: allergies, current medications, past family history, past medical history, past social history, past surgical history and problem list.   Health Maintenance:  papsmear: never completed.  Review of Systems:  Negative except noted in HPI  Objective:  Physical Exam BP 109/66   Pulse 65   Ht 5\' 3"  (1.6 m)   Wt 108 lb (49 kg)   LMP 05/04/2022   BMI 19.13 kg/m  CONSTITUTIONAL: Well-developed, well-nourished female in no acute distress.  HENT:  Normocephalic, atraumatic EYES: Conjunctivae and EOM are normal NECK: Normal range of motion NEUROLOGIC: Alert and oriented to person, place, and time PSYCHIATRIC: Normal mood and affect CARDIOVASCULAR: Normal heart rate  noted RESPIRATORY: Effort and rate normal MUSCULOSKELETAL: Normal range of motion  Labs and Imaging No results found for this or any previous visit (from the past 24 hour(s)).  Assessment & Plan:   1. Dysmenorrhea   - Rx [place]: warning signs discussed, informed of possible BTB - Discussed need for pap screening, pt very adverse, could offer self collection with instruction, she will think about it and let 05/06/2022 know Follow up in 1 month (after return from trip)   I spent 20 minutes dedicated to the care of this patient including pre-visit review of records, face to face time with the patient discussing her conditions and treatments and post visit ordering of testing.  [place], CNM 05/15/2022 9:54 AM

## 2022-06-02 ENCOUNTER — Encounter: Payer: Self-pay | Admitting: Family Medicine

## 2022-06-02 ENCOUNTER — Ambulatory Visit (INDEPENDENT_AMBULATORY_CARE_PROVIDER_SITE_OTHER): Payer: Commercial Managed Care - PPO | Admitting: Family Medicine

## 2022-06-02 VITALS — BP 108/60 | HR 70 | Temp 98.5°F | Ht 63.0 in | Wt 106.0 lb

## 2022-06-02 DIAGNOSIS — Z23 Encounter for immunization: Secondary | ICD-10-CM | POA: Diagnosis not present

## 2022-06-02 DIAGNOSIS — Z1159 Encounter for screening for other viral diseases: Secondary | ICD-10-CM

## 2022-06-02 DIAGNOSIS — Z0000 Encounter for general adult medical examination without abnormal findings: Secondary | ICD-10-CM | POA: Diagnosis not present

## 2022-06-02 LAB — COMPREHENSIVE METABOLIC PANEL
ALT: 13 U/L (ref 0–35)
AST: 15 U/L (ref 0–37)
Albumin: 4.8 g/dL (ref 3.5–5.2)
Alkaline Phosphatase: 44 U/L (ref 39–117)
BUN: 8 mg/dL (ref 6–23)
CO2: 26 mEq/L (ref 19–32)
Calcium: 9.9 mg/dL (ref 8.4–10.5)
Chloride: 105 mEq/L (ref 96–112)
Creatinine, Ser: 0.6 mg/dL (ref 0.40–1.20)
GFR: 124.3 mL/min (ref >60.00)
Glucose, Bld: 95 mg/dL (ref 70–99)
Potassium: 5.1 mEq/L (ref 3.5–5.1)
Sodium: 135 mEq/L (ref 135–145)
Total Bilirubin: 0.5 mg/dL (ref 0.2–1.2)
Total Protein: 7.3 g/dL (ref 6.0–8.3)

## 2022-06-02 LAB — CBC
HCT: 40.9 % (ref 36.0–46.0)
Hemoglobin: 13.5 g/dL (ref 12.0–15.0)
MCHC: 32.9 g/dL (ref 30.0–36.0)
MCV: 100.2 fl — ABNORMAL HIGH (ref 78.0–100.0)
Platelets: 248 10*3/uL (ref 150.0–400.0)
RBC: 4.08 Mil/uL (ref 3.87–5.11)
RDW: 12.7 % (ref 11.5–15.5)
WBC: 6.4 10*3/uL (ref 4.0–10.5)

## 2022-06-02 LAB — LIPID PANEL
Cholesterol: 155 mg/dL (ref 0–200)
HDL: 43 mg/dL (ref >39.00)
LDL Cholesterol: 82 mg/dL (ref 0–99)
NonHDL: 112.49
Total CHOL/HDL Ratio: 4
Triglycerides: 152 mg/dL — ABNORMAL HIGH (ref 0.0–149.0)
VLDL: 30.4 mg/dL (ref 0.0–40.0)

## 2022-06-02 NOTE — Patient Instructions (Addendum)
Give us 2-3 business days to get the results of your labs back.   Keep the diet clean and stay active.  Please get me a copy of your advanced directive form at your convenience.   I recommend getting the flu shot in mid October. This suggestion would change if the CDC comes out with a different recommendation.   Call [facility] [place] [facility] at [phone] for an appointment.  They are located at [address], [address], [place], [place], [address] (right across the hall from our office).  Let us know if you need anything.  

## 2022-06-02 NOTE — Progress Notes (Signed)
Chief Complaint  Patient presents with   Annual Exam     Well Woman Jody Murray is here for a complete physical.   Her last physical was >1 year ago.  Current diet: in general, a "healthy" diet. Current exercise: some walking. Contraception? Yes Patient's last menstrual period was 05/04/2022.  Fatigue out of ordinary? No Seatbelt? Yes Advanced directive? No  Health Maintenance Pap/HPV- No Tetanus- Yes HIV screening- Yes Hep C screening- No  Past Medical History:  Diagnosis Date   Complication of anesthesia    PONV, takes a little longer waking up   Congenital deafness    Rt ear   History of chicken pox    History of kidney stones    Pneumonia    age 82     Past Surgical History:  Procedure Laterality Date   EXTERNAL EAR SURGERY Right    FOOT SURGERY Left    History of kidney stones     URETEROSCOPY WITH HOLMIUM LASER LITHOTRIPSY Left 06/17/2018   Procedure: LEFT URETEROSCOPY WITH BASKET EXTRACTION OF LEFT URETERAL STONE;  Surgeon: Ihor Gully, MD;  Location: [facility];  Service: Urology;  Laterality: Left;   WISDOM TOOTH EXTRACTION      Medications  Current Outpatient Medications on File Prior to Visit  Medication Sig Dispense Refill   levonorgestrel-ethinyl estradiol (SEASONALE) 0.15-0.03 MG tablet Take 1 tablet by mouth daily. 91 tablet 1    Allergies Allergies  Allergen Reactions   Gluten Meal Anaphylaxis    Gum swelling  Gum swelling     Milk (Cow) Nausea And Vomiting    Review of Systems: Constitutional:  no unexpected weight changes Eye:  no recent significant change in vision Ear/Nose/Mouth/Throat:  Ears:  no tinnitus or vertigo and no recent change in hearing Nose/Mouth/Throat:  no complaints of nasal congestion, no sore throat Cardiovascular: no chest pain Respiratory:  no cough and no shortness of breath Gastrointestinal:  no abdominal pain, no change in bowel habits GU:  Female: negative for dysuria or pelvic  pain Musculoskeletal/Extremities:  no pain of the joints Integumentary (Skin/Breast):  no abnormal skin lesions reported Neurologic:  no headaches Endocrine:  denies fatigue Hematologic/Lymphatic:  No areas of easy bleeding  Exam BP 108/60   Pulse 70   Temp 98.5 F (36.9 C) (Oral)   Ht 5\' 3"  (1.6 m)   Wt 106 lb (48.1 kg)   LMP 05/04/2022   SpO2 97%   BMI 18.78 kg/m  General:  well developed, well nourished, in no apparent distress Skin:  no significant moles, warts, or growths Head:  no masses, lesions, or tenderness Eyes:  pupils equal and round, sclera anicteric without injection Ears:  canals without lesions, TMs shiny without retraction, no obvious effusion, no erythema Nose:  nares patent, septum midline, mucosa normal, and no drainage or sinus tenderness Throat/Pharynx:  lips and gingiva without lesion; tongue and uvula midline; non-inflamed pharynx; no exudates or postnasal drainage Neck: neck supple without adenopathy, thyromegaly, or masses Lungs:  clear to auscultation, breath sounds equal bilaterally, no respiratory distress Cardio:  regular rate and rhythm, no bruits, no LE edema Abdomen:  abdomen soft, nontender; bowel sounds normal; no masses or organomegaly Genital: Defer to GYN Musculoskeletal:  symmetrical muscle groups noted without atrophy or deformity Extremities:  no clubbing, cyanosis, or edema, no deformities, no skin discoloration Neuro:  gait normal; deep tendon reflexes normal and symmetric Psych: well oriented with normal range of affect and appropriate judgment/insight  Assessment and Plan  Well adult exam - Plan: CBC, Comprehensive metabolic panel, Lipid panel  Encounter for hepatitis C screening test for low risk patient - Plan: Hepatitis C antibody   Well 26 y.o. female. Counseled on diet and exercise. Advanced directive form provided today.  Other orders as above. Refuses pelvic exams, I will provide her GYN info in her paperwork should she  change her mind.  2nd HPV in 1 mo, 3rd in 6 mo from now.  Follow up in 1 yr or prn. The patient voiced understanding and agreement to the plan.  Jody Roche [place], DO 06/02/22 11:19 AM

## 2022-06-03 ENCOUNTER — Other Ambulatory Visit: Payer: Self-pay | Admitting: Family Medicine

## 2022-06-03 ENCOUNTER — Other Ambulatory Visit (INDEPENDENT_AMBULATORY_CARE_PROVIDER_SITE_OTHER): Payer: Commercial Managed Care - PPO

## 2022-06-03 DIAGNOSIS — R718 Other abnormality of red blood cells: Secondary | ICD-10-CM | POA: Diagnosis not present

## 2022-06-03 LAB — B12 AND FOLATE PANEL
Folate: 22.2 ng/mL (ref >5.9)
Vitamin B-12: 324 pg/mL (ref 211–911)

## 2022-06-03 LAB — HEPATITIS C ANTIBODY: Hepatitis C Ab: NONREACTIVE

## 2022-06-18 ENCOUNTER — Encounter: Payer: Commercial Managed Care - PPO | Admitting: Obstetrics & Gynecology

## 2022-07-02 ENCOUNTER — Ambulatory Visit: Payer: Commercial Managed Care - PPO

## 2022-07-02 ENCOUNTER — Ambulatory Visit (INDEPENDENT_AMBULATORY_CARE_PROVIDER_SITE_OTHER): Payer: Commercial Managed Care - PPO | Admitting: Family Medicine

## 2022-07-02 ENCOUNTER — Telehealth: Payer: Self-pay

## 2022-07-02 ENCOUNTER — Encounter: Payer: Self-pay | Admitting: Family Medicine

## 2022-07-02 VITALS — BP 102/60 | HR 74 | Temp 97.8°F | Resp 18 | Ht 63.0 in | Wt 109.6 lb

## 2022-07-02 DIAGNOSIS — R059 Cough, unspecified: Secondary | ICD-10-CM | POA: Diagnosis not present

## 2022-07-02 DIAGNOSIS — H6982 Other specified disorders of Eustachian tube, left ear: Secondary | ICD-10-CM

## 2022-07-02 DIAGNOSIS — U071 COVID-19: Secondary | ICD-10-CM

## 2022-07-02 LAB — POC COVID19 BINAXNOW: SARS Coronavirus 2 Ag: POSITIVE — AB

## 2022-07-02 MED ORDER — PREDNISONE 20 MG PO TABS: 40.0000 mg | ORAL_TABLET | Freq: Every day | ORAL | 0 refills | Status: AC

## 2022-07-02 MED ORDER — MOLNUPIRAVIR EUA 200MG CAPSULE: 4.0000 | ORAL_CAPSULE | Freq: Two times a day (BID) | ORAL | 0 refills | Status: AC

## 2022-07-02 MED ORDER — BENZONATATE 100 MG PO CAPS: 100.0000 mg | ORAL_CAPSULE | Freq: Three times a day (TID) | ORAL | 0 refills | Status: DC | PRN

## 2022-07-02 NOTE — Telephone Encounter (Signed)
Initial Comment Caller states daughter has an appointment today and needs to actually be seen today and not just get a shot. States she is sick. States she has ear pain and nasel drainage. Translation No No Triage Reason Patient declined Nurse Assessment Nurse: Lily Kocher, RN, Adriana Date/Time (Eastern Time): 07/02/2022 8:14:14 AM Confirm and document reason for call. If symptomatic, describe symptoms. ---caller states pt is already scheduled to be seen today at 1415. Does the patient have any new or worsening symptoms? ---Yes Will a triage be completed? ---No Select reason for no triage. ---Patient declined Disp. Time Lamount Cohen Time) Disposition Final User 07/02/2022 8:15:21 AM Clinical Call Yes Lily Kocher RN, Adriana Final Disposition 07/02/2022 8:15:21 AM Clinical Call Yes Lily Kocher, RN, Ricki Rodriguez

## 2022-07-02 NOTE — Patient Instructions (Signed)
Continue to push fluids, practice good hand hygiene, and cover your mouth if you cough.  If you start having fevers, shaking or shortness of breath, seek immediate care.  For the first 5 days you should quarantine. Day 6-10 you can return to society with a mask as long as you are fever-free and have improving or resolved symptoms. Day 11 and beyond you can return maskless as long as you are fever-free with improved/resolved symptoms.   OK to take Tylenol 1000 mg (2 extra strength tabs) or 975 mg (3 regular strength tabs) every 6 hours as needed.  Let us know if you need anything.

## 2022-07-02 NOTE — Telephone Encounter (Signed)
Appt today w/ PCP.  

## 2022-07-02 NOTE — Progress Notes (Signed)
Chief Complaint  Patient presents with   URI    Sxs started Tuesday after traveling to [place]. runny nose, congestion, ear pain, no covid test    Sherian Rein here for URI complaints. Here w mom.  Duration: 3 days  Associated symptoms: subjective fever, sinus congestion, rhinorrhea, ear pain, sore throat, and coughing, fatigue, loss of taste/smell Denies: sinus pain, itchy watery eyes, ear drainage, wheezing, shortness of breath, and N/V Treatment to date: Dayquil, Tylenol Sick contacts: Yes- dad  Past Medical History:  Diagnosis Date   Complication of anesthesia    PONV, takes a little longer waking up   Congenital deafness    Rt ear   History of chicken pox    History of kidney stones    Pneumonia    age 26    Objective BP 102/60 (BP Location: Left Arm, Patient Position: Sitting, Cuff Size: Normal)   Pulse 74   Temp 97.8 F (36.6 C) (Temporal)   Resp 18   Ht 5\' 3"  (1.6 m)   Wt 109 lb 9.6 oz (49.7 kg)   SpO2 99%   BMI 19.41 kg/m  General: Awake, alert, appears stated age HEENT: AT, , ears patent b/l and TM retracted on the left without erythema or fluid, surgical pseudo membrane noted on the R, nares patent w/o discharge, pharynx pink and without exudates, MMM Neck: No masses or asymmetry Heart: RRR Lungs: CTAB, no accessory muscle use Psych: Age appropriate judgment and insight, normal mood and affect  COVID-19 - Plan: molnupiravir EUA (LAGEVRIO) 200 mg CAPS capsule  Cough, unspecified type - Plan: POC COVID-19 BinaxNow, benzonatate (TESSALON) 100 MG capsule  ETD (Eustachian tube dysfunction), left - Plan: predniSONE (DELTASONE) 20 MG tablet  She tested positive for COVID today in our clinic.  Mom and daughter would have more peace of mind with an antiviral on board.  We will send in molnupiravir as Paxlovid could interact with her birth control.  We discussed the CDC quarantining guidelines.  Tessalon Perles as needed for coughing.  5-day prednisone burst 40  mg daily for eustachian tube dysfunction and to prevent ear infections. Continue to push fluids, practice good hand hygiene, cover mouth when coughing. F/u prn. If starting to experience irreplaceable fluid loss, shaking, or shortness of breath, seek immediate care. Pt voiced understanding and agreement to the plan.  [place], DO 07/02/22 3:54 PM

## 2022-07-17 ENCOUNTER — Ambulatory Visit: Payer: Commercial Managed Care - PPO | Admitting: Certified Nurse Midwife

## 2022-07-17 ENCOUNTER — Encounter: Payer: Self-pay | Admitting: Certified Nurse Midwife

## 2022-07-17 VITALS — BP 128/82 | HR 71 | Ht 63.0 in | Wt 110.0 lb

## 2022-07-17 DIAGNOSIS — Z3041 Encounter for surveillance of contraceptive pills: Secondary | ICD-10-CM | POA: Diagnosis not present

## 2022-07-17 NOTE — Progress Notes (Unsigned)
Pt states OCP is helping and has noticed a lot of improvement

## 2022-07-18 ENCOUNTER — Encounter: Payer: Self-pay | Admitting: Certified Nurse Midwife

## 2022-07-18 NOTE — Progress Notes (Signed)
   GYNECOLOGY OFFICE VISIT NOTE  History:  26 y.o. G0P0000 here today for follow after starting OCPs for period management. She has been taking Seasonale since August and is still in her first pack. She has not had any bleeding or spotting or side effects. She is satisfied with the pills and would like to continue. Denies any abnormal vaginal discharge, bleeding, pelvic pain or other concerns.   Past Medical History:  Diagnosis Date   Complication of anesthesia    PONV, takes a little longer waking up   Congenital deafness    Rt ear   History of chicken pox    History of kidney stones    Pneumonia    age 33    Past Surgical History:  Procedure Laterality Date   EXTERNAL EAR SURGERY Right    FOOT SURGERY Left    History of kidney stones     URETEROSCOPY WITH HOLMIUM LASER LITHOTRIPSY Left 06/17/2018   Procedure: LEFT URETEROSCOPY WITH BASKET EXTRACTION OF LEFT URETERAL STONE;  Surgeon: Kathie Rhodes, MD;  Location: [place];  Service: Urology;  Laterality: Left;   WISDOM TOOTH EXTRACTION      The following portions of the patient's history were reviewed and updated as appropriate: allergies, current medications, past family history, past medical history, past social history, past surgical history and problem list.   Health Maintenance:  declines pap (never had). Gardasil series completed.  Review of Systems:  Negative except noted in HPI  Objective:  Physical Exam BP 128/82   Pulse 71   Ht 5\' 3"  (1.6 m)   Wt 110 lb (49.9 kg)   LMP 05/06/2022 (Approximate)   BMI 19.49 kg/m  CONSTITUTIONAL: Well-developed, well-nourished female in no acute distress.  HENT:  Normocephalic, atraumatic EYES: Conjunctivae and EOM are normal NECK: Normal range of motion NEUROLOGIC: Alert and oriented to person, place, and time PSYCHIATRIC: Normal mood and affect CARDIOVASCULAR: Normal heart rate noted RESPIRATORY: Effort and rate normal MUSCULOSKELETAL: Normal range of  motion  Labs and Imaging No results found for this or any previous visit (from the past 24 hour(s)).  Assessment & Plan:   1. Oral contraceptive pill surveillance     Follow up with GYN in 1 yr or prn   I spent 15 minutes dedicated to the care of this patient including pre-visit review of records, face to face time with the patient discussing her conditions and treatments and post visit ordering of testing.  Julianne Handler, CNM 07/18/2022 10:34 AM

## 2022-07-20 NOTE — Addendum Note (Signed)
Addended by: Lurene Robley E on: 07/20/2022 09:33 AM   Modules accepted: Level of Service  

## 2022-08-12 ENCOUNTER — Encounter: Payer: Self-pay | Admitting: Internal Medicine

## 2022-08-13 ENCOUNTER — Encounter: Payer: Self-pay | Admitting: Family Medicine

## 2022-08-13 ENCOUNTER — Ambulatory Visit (INDEPENDENT_AMBULATORY_CARE_PROVIDER_SITE_OTHER): Payer: Self-pay | Admitting: Family Medicine

## 2022-08-13 VITALS — BP 114/59 | HR 66 | Temp 98.8°F | Resp 16 | Wt 109.0 lb

## 2022-08-13 DIAGNOSIS — R1013 Epigastric pain: Secondary | ICD-10-CM

## 2022-08-13 NOTE — Patient Instructions (Signed)
Consider taking famotidine/Pepcid 20 mg 1-2 times daily for both this and in the future.  OK to take Tylenol 1000 mg (2 extra strength tabs) or 975 mg (3 regular strength tabs) every 6 hours as needed.  Stay hydrated.  This should improve with time.   Let us know if you need anything.

## 2022-08-13 NOTE — Progress Notes (Signed)
Chief Complaint  Patient presents with   Abdominal Pain    Patient complains of epigastric pain, radiating to chest    Jody Murray is here for abdominal pain. Here w mom.   Duration: 5 days; started after she had a deep fried oreo.  Nighttime awakenings? No Bleeding? No Weight loss? No Palliation: laying down Provocation: none Associated symptoms: nausea Denies: fever, vomiting, and bowel changes Treatment to date: lemon tea, ibuprofen Better today.  No sick contacts.   Past Medical History:  Diagnosis Date   Complication of anesthesia    PONV, takes a little longer waking up   Congenital deafness    Rt ear   History of chicken pox    History of kidney stones    Pneumonia    age 5    BP (!) 114/59 (BP Location: Right Arm, Patient Position: Sitting, Cuff Size: Small)   Pulse 66   Temp 98.8 F (37.1 C) (Oral)   Resp 16   Wt 109 lb (49.4 kg)   LMP 05/06/2022 (Approximate)   SpO2 100%   BMI 19.31 kg/m  Gen.: Awake, alert, appears stated age 26: Mucous membranes moist without mucosal lesions Heart: Regular rate and rhythm without murmurs Lungs: Clear auscultation bilaterally, no rales or wheezing, normal effort without accessory muscle use. Abdomen: Bowel sounds are present. Abdomen is soft, diffusely ttp but worse in epigastric region, nondistended, no masses or organomegaly. Negative Murphy's, Rovsing's, McBurney's, and Carnett's sign. Psych: Age appropriate judgment and insight. Normal mood and affect.  Dyspepsia  I think the fried food caused a cascade of abd issues. This should spontaneously resolve. Tylenol, hydration, Pepcid prn. Avoid food triggers. No red flag s/s's.  F/u prn Pt and her mom voiced understanding and agreement to the plan.  Hendricks, DO 08/13/22 1:44 PM

## 2022-09-23 ENCOUNTER — Ambulatory Visit (INDEPENDENT_AMBULATORY_CARE_PROVIDER_SITE_OTHER): Payer: Medicaid Other | Admitting: Internal Medicine

## 2022-09-23 ENCOUNTER — Encounter: Payer: Self-pay | Admitting: Internal Medicine

## 2022-09-23 VITALS — BP 122/68 | HR 67 | Ht 63.0 in | Wt 112.1 lb

## 2022-09-23 DIAGNOSIS — R109 Unspecified abdominal pain: Secondary | ICD-10-CM

## 2022-09-23 NOTE — Patient Instructions (Signed)
_______________________________________________________  If you are age 26 or older, your body mass index should be between 23-30. Your Body mass index is 19.86 kg/m. If this is out of the aforementioned range listed, please consider follow up with your Primary Care Provider.  If you are age 44 or younger, your body mass index should be between 19-25. Your Body mass index is 19.86 kg/m. If this is out of the aformentioned range listed, please consider follow up with your Primary Care Provider.   ________________________________________________________  The [facility] providers would like to encourage you to use [facility] to communicate with providers for non-urgent requests or questions.  Due to long hold times on the telephone, sending your provider a message by [facility] may be a faster and more efficient way to get a response.  Please allow 48 business hours for a response.  Please remember that this is for non-urgent requests.  _______________________________________________________  Jody Murray will be contacted by [facility] in the next 2 days to arrange an abdominal ultrasound.  The number on your caller ID will be [phone], please answer when they call.  If you have not heard from them in 2 days please call [phone] to schedule.     You have been scheduled for an endoscopy. Please follow written instructions given to you at your visit today. If you use inhalers (even only as needed), please bring them with you on the day of your procedure.

## 2022-09-23 NOTE — Progress Notes (Signed)
HISTORY OF PRESENT ILLNESS:  Jody Murray is a 26 y.o. 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  She is accompanied by her mother.  She has a history of kidney stones for which she is followed by urology.  She also has a history of congenital hearing loss for which she has hearing aids.  The patient and her mother report that she has had problems with recurrent abdominal pain for several years.  She describes it in the mid abdomen in both the epigastric and periumbilical and pelvic regions.  Mostly in the midline.  The pain tends to occur every several months.  It may last anywhere from 1 day to a week.  In between these episodes she feels fine.  During episodes she experiences nausea but no vomiting.  She can eat without difficulty.  There is no change in her bowel movements or no change in her symptoms with defecation.  Discomfort can radiate into the chest.  She denies classic reflux symptoms.  No dysphagia.  Her weight has been stable.  She does notice that the discomfort is worse when direct pressure is applied to the painful area.  Her mother has given her hydrocodone on occasion for the pain.  Patient does live at home with her parents, currently.  Her last episode of pain was August 13, 2022.  She saw her PCP at that time.  I have reviewed that encounter.  No work-up.  Tylenol and Pepcid recommended as well as a GI office appointment.  Review of blood work from June 02, 2022 shows normal comprehensive metabolic panel including liver tests.  Normal CBC with hemoglobin 13.5.  Patient did undergo a contrast-enhanced CT scan of the abdomen and pelvis August 2022.  No acute abnormalities.  Small amount of pelvic free fluid noted.  She does take birth control.  She has not had a formal gynecologic evaluation for this pain.  REVIEW OF SYSTEMS:  All non-GI ROS negative unless otherwise stated in the HPI except  for kidney stones  Past Medical History:  Diagnosis Date   Complication of anesthesia    PONV, takes a little longer waking up   Congenital deafness    Rt ear   History of chicken pox    History of kidney stones    Pneumonia    age 38    Past Surgical History:  Procedure Laterality Date   EXTERNAL EAR SURGERY Right    FOOT SURGERY Left    History of kidney stones     tail bone fracture     URETEROSCOPY WITH HOLMIUM LASER LITHOTRIPSY Left 06/17/2018   Procedure: LEFT URETEROSCOPY WITH BASKET EXTRACTION OF LEFT URETERAL STONE;  Surgeon: Ihor Gully, MD;  Location: [facility];  Service: Urology;  Laterality: Left;   WISDOM TOOTH EXTRACTION      Social History Rafaelita Foister  reports that she has never smoked. She has never used smokeless tobacco. She reports that she does not drink alcohol and does not use drugs.  family history includes Cancer in her father, paternal grandfather, and paternal grandmother; Diabetes in her maternal grandmother; Kidney Stones in her brother and father.  Allergies  Allergen Reactions   Gluten Meal Anaphylaxis    Gum swelling  Gum swelling     Milk (Cow) Nausea And Vomiting       PHYSICAL EXAMINATION: Vital signs: BP 122/68   Pulse 67   Ht 5'  3" (1.6 m)   Wt 112 lb 2 oz (50.9 kg)   BMI 19.86 kg/m   Constitutional: generally well-appearing, no acute distress Psychiatric: alert and oriented x3, cooperative Eyes: extraocular movements intact, anicteric, conjunctiva pink Ears: Hearing aids Mouth: oral pharynx moist, no lesions.  Pierced lower lip Neck: supple no lymphadenopathy Cardiovascular: heart regular rate and rhythm, no murmur Lungs: clear to auscultation bilaterally Abdomen: soft, nontender, nondistended, no obvious ascites, no peritoneal signs, normal bowel sounds, no organomegaly Rectal: Omitted Extremities: no clubbing, cyanosis, or lower extremity edema bilaterally Skin: Multiple ear piercings.  No lesions  on visible extremities Neuro: No focal deficits.  Cranial nerves intact  ASSESSMENT:  1.  Several year history of recurrent abdominal pain as described.  Rule out ulcer disease.  Rule out biliary colic.  Rule out pancreatitis.  Rule out gynecologic cause.  Rule out functional. 2.  History of kidney stones requiring urologic intervention   PLAN:  1.  Schedule upper endoscopy to further evaluate pain.The nature of the procedure, as well as the risks, benefits, and alternatives were carefully and thoroughly reviewed with the patient. Ample time for discussion and questions allowed. The patient understood, was satisfied, and agreed to proceed. 2.  Schedule abdominal ultrasound to further evaluate pain. 3.  Consider PPI 4.  Consider antispasmodic, such as Bentyl 5.  Advised to secure a gynecology appointment to evaluate for gynecologic causes of pain such as endometriosis or ovarian cystic disease. 6.  Further recommendations after the above A total time of 60 minutes was spent preparing to see the patient, reviewing outside data, obtaining comprehensive history, performing medically appropriate physical examination, counseling and educating the patient and her mother regarding the above listed issues, ordering advanced radiology study, ordering endoscopic procedure, and documenting clinical information in the health record

## 2022-09-24 ENCOUNTER — Ambulatory Visit (AMBULATORY_SURGERY_CENTER): Payer: Medicaid Other | Admitting: Internal Medicine

## 2022-09-24 ENCOUNTER — Encounter: Payer: Self-pay | Admitting: Internal Medicine

## 2022-09-24 VITALS — BP 108/61 | HR 65 | Temp 98.9°F | Resp 14 | Ht 63.0 in | Wt 112.0 lb

## 2022-09-24 DIAGNOSIS — R109 Unspecified abdominal pain: Secondary | ICD-10-CM

## 2022-09-24 MED ORDER — DICYCLOMINE HCL 20 MG PO TABS: ORAL_TABLET | ORAL | 3 refills | Status: AC

## 2022-09-24 MED ORDER — SODIUM CHLORIDE 0.9 % IV SOLN: 500.0000 mL | Freq: Once | INTRAVENOUS | Status: DC

## 2022-09-24 NOTE — Progress Notes (Signed)
Report to PACU, RN, vss, BBS= Clear.  

## 2022-09-24 NOTE — Patient Instructions (Signed)
    Resume usual diet & medications   Keep plans for abdominal ultrasound   Schedule a Gynecology appointment  to r/o cause for recurrent pain  Bentyl 20 mg every 6 hours as needed for severe pain- order sent to your pharmacy   YOU HAD AN ENDOSCOPIC PROCEDURE TODAY AT THE [facility]:   Refer to the procedure report that was given to you for any specific questions about what was found during the examination.  If the procedure report does not answer your questions, please call your gastroenterologist to clarify.  If you requested that your care partner not be given the details of your procedure findings, then the procedure report has been included in a sealed envelope for you to review at your convenience later.  YOU SHOULD EXPECT: Some feelings of bloating in the abdomen. Passage of more gas than usual.  Walking can help get rid of the air that was put into your GI tract during the procedure and reduce the bloating. If you had a lower endoscopy (such as a colonoscopy or flexible sigmoidoscopy) you may notice spotting of blood in your stool or on the toilet paper. If you underwent a bowel prep for your procedure, you may not have a normal bowel movement for a few days.  Please Note:  You might notice some irritation and congestion in your nose or some drainage.  This is from the oxygen used during your procedure.  There is no need for concern and it should clear up in a day or so.  SYMPTOMS TO REPORT IMMEDIATELY:    Following upper endoscopy (EGD)  Vomiting of blood or coffee ground material  New chest pain or pain under the shoulder blades  Painful or persistently difficult swallowing  New shortness of breath  Fever of 100F or higher  Black, tarry-looking stools  For urgent or emergent issues, a gastroenterologist can be reached at any hour by calling [phone]. Do not use MyChart messaging for urgent concerns.    DIET:  We do recommend a small meal at first, but  then you may proceed to your regular diet.  Drink plenty of fluids but you should avoid alcoholic beverages for 24 hours.  ACTIVITY:  You should plan to take it easy for the rest of today and you should NOT DRIVE or use heavy machinery until tomorrow (because of the sedation medicines used during the test).    FOLLOW UP: Our staff will call the number listed on your records the next business day following your procedure.  We will call around 7:15- 8:00 am to check on you and address any questions or concerns that you may have regarding the information given to you following your procedure. If we do not reach you, we will leave a message.     If any biopsies were taken you will be contacted by phone or by letter within the next 1-3 weeks.  Please call us at [phone] if you have not heard about the biopsies in 3 weeks.    SIGNATURES/CONFIDENTIALITY: You and/or your care partner have signed paperwork which will be entered into your electronic medical record.  These signatures attest to the fact that that the information above on your After Visit Summary has been reviewed and is understood.  Full responsibility of the confidentiality of this discharge information lies with you and/or your care-partner.

## 2022-09-24 NOTE — Progress Notes (Signed)
VS completed by DT.  Pt's states no medical or surgical changes since previsit or office visit.  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

## 2022-09-24 NOTE — Op Note (Signed)
[facility] Patient Name: Jody Murray Procedure Date: 09/24/2022 9:24 AM MRN: [id number] Endoscopist: Wilhemina Bonito. Marina Goodell , MD, [id number] Age: 26 Referring MD:  Date of Birth: 1996-04-07 Gender: Female Account #: [id number] Procedure:                Upper GI endoscopy Indications:              Abdominal pain Medicines:                Monitored Anesthesia Care Procedure:                Pre-Anesthesia Assessment:                           - Prior to the procedure, a History and Physical                            was performed, and patient medications and                            allergies were reviewed. The patient's tolerance of                            previous anesthesia was also reviewed. The risks                            and benefits of the procedure and the sedation                            options and risks were discussed with the patient.                            All questions were answered, and informed consent                            was obtained. Prior Anticoagulants: The patient has                            taken no anticoagulant or antiplatelet agents. ASA                            Grade Assessment: II - A patient with mild systemic                            disease. After reviewing the risks and benefits,                            the patient was deemed in satisfactory condition to                            undergo the procedure.                           After obtaining informed consent, the endoscope was  passed under direct vision. Throughout the                            procedure, the patient's blood pressure, pulse, and                            oxygen saturations were monitored continuously. The                            GIF [id number] #[id number] was introduced through the                            mouth, and advanced to the second part of duodenum.                            The upper GI endoscopy was accomplished  without                            difficulty. The patient tolerated the procedure                            well. Scope In: Scope Out: Findings:                 The esophagus was normal.                           The stomach was normal.                           The examined duodenum was normal.                           The cardia and gastric fundus were normal on                            retroflexion. Complications:            No immediate complications. Estimated Blood Loss:     Estimated blood loss: none. Impression:               1. Normal upper endoscopy                           2. No cause for abdominal pain found on today's                            exam. Recommendation:           1. Patient has a contact number available for                            emergencies. The signs and symptoms of potential                            delayed complications were discussed with the  patient. Return to normal activities tomorrow.                            Written discharge instructions were provided to the                            patient.                           2. Resume previous diet.                           3. Continue present medications                           4. Keep plans for abdominal ultrasound. Will                            contact you after the results are made available.                           5. As previously recommended, please schedule an                            appointment with your gynecologist to rule out a                            gynecologic cause for her recurrent pain                           6. PRESCRIBE Bentyl (dicyclomine) 20 mg; #30; take                            1 by mouth every 6 hours as needed for severe pain;                            3 refills Lamon Rotundo N. Marina Goodell, MD 09/24/2022 9:42:13 AM This report has been signed electronically.

## 2022-09-24 NOTE — Progress Notes (Signed)
HISTORY OF PRESENT ILLNESS:   Jody Murray is a 26 y.o. 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  She is accompanied by her mother.  She has a history of kidney stones for which she is followed by urology.  She also has a history of congenital hearing loss for which she has hearing aids.   The patient and her mother report that she has had problems with recurrent abdominal pain for several years.  She describes it in the mid abdomen in both the epigastric and periumbilical and pelvic regions.  Mostly in the midline.  The pain tends to occur every several months.  It may last anywhere from 1 day to a week.  In between these episodes she feels fine.  During episodes she experiences nausea but no vomiting.  She can eat without difficulty.  There is no change in her bowel movements or no change in her symptoms with defecation.  Discomfort can radiate into the chest.  She denies classic reflux symptoms.  No dysphagia.  Her weight has been stable.  She does notice that the discomfort is worse when direct pressure is applied to the painful area.  Her mother has given her hydrocodone on occasion for the pain.  Patient does live at home with her parents, currently.  Her last episode of pain was August 13, 2022.  She saw her PCP at that time.  I have reviewed that encounter.  No work-up.  Tylenol and Pepcid recommended as well as a GI office appointment.   Review of blood work from June 02, 2022 shows normal comprehensive metabolic panel including liver tests.  Normal CBC with hemoglobin 13.5.  Patient did undergo a contrast-enhanced CT scan of the abdomen and pelvis August 2022.  No acute abnormalities.  Small amount of pelvic free fluid noted.  She does take birth control.  She has not had a formal gynecologic evaluation for this pain.   REVIEW OF SYSTEMS:   All non-GI ROS negative unless otherwise stated in the HPI  except for kidney stones       Past Medical History:  Diagnosis Date   Complication of anesthesia      PONV, takes a little longer waking up   Congenital deafness      Rt ear   History of chicken pox     History of kidney stones     Pneumonia      age 67           Past Surgical History:  Procedure Laterality Date   EXTERNAL EAR SURGERY Right     FOOT SURGERY Left     History of kidney stones       tail bone fracture       URETEROSCOPY WITH HOLMIUM LASER LITHOTRIPSY Left 06/17/2018    Procedure: LEFT URETEROSCOPY WITH BASKET EXTRACTION OF LEFT URETERAL STONE;  Surgeon: Ihor Gully, MD;  Location: [facility];  Service: Urology;  Laterality: Left;   WISDOM TOOTH EXTRACTION          Social History Zaelynn Fuchs  reports that she has never smoked. She has never used smokeless tobacco. She reports that she does not drink alcohol and does not use drugs.   family history includes Cancer in her father, paternal grandfather, and paternal grandmother; Diabetes in her maternal grandmother; Kidney Stones in her brother and father.        Allergies  Allergen  Reactions   Gluten Meal Anaphylaxis      Gum swelling  Gum swelling      Milk (Cow) Nausea And Vomiting          PHYSICAL EXAMINATION: Vital signs: BP 122/68   Pulse 67   Ht 5\' 3"  (1.6 m)   Wt 112 lb 2 oz (50.9 kg)   BMI 19.86 kg/m   Constitutional: generally well-appearing, no acute distress Psychiatric: alert and oriented x3, cooperative Eyes: extraocular movements intact, anicteric, conjunctiva pink Ears: Hearing aids Mouth: oral pharynx moist, no lesions.  Pierced lower lip Neck: supple no lymphadenopathy Cardiovascular: heart regular rate and rhythm, no murmur Lungs: clear to auscultation bilaterally Abdomen: soft, nontender, nondistended, no obvious ascites, no peritoneal signs, normal bowel sounds, no organomegaly Rectal: Omitted Extremities: no clubbing, cyanosis, or lower extremity edema  bilaterally Skin: Multiple ear piercings.  No lesions on visible extremities Neuro: No focal deficits.  Cranial nerves intact   ASSESSMENT:   1.  Several year history of recurrent abdominal pain as described.  Rule out ulcer disease.  Rule out biliary colic.  Rule out pancreatitis.  Rule out gynecologic cause.  Rule out functional. 2.  History of kidney stones requiring urologic intervention     PLAN:   1.  Schedule upper endoscopy to further evaluate pain.The nature of the procedure, as well as the risks, benefits, and alternatives were carefully and thoroughly reviewed with the patient. Ample time for discussion and questions allowed. The patient understood, was satisfied, and agreed to proceed. 2.  Schedule abdominal ultrasound to further evaluate pain. 3.  Consider PPI 4.  Consider antispasmodic, such as Bentyl 5.  Advised to secure a gynecology appointment to evaluate for gynecologic causes of pain such as endometriosis or ovarian cystic disease. 6.  Further recommendations after the above

## 2022-09-25 ENCOUNTER — Telehealth: Payer: Self-pay | Admitting: *Deleted

## 2022-09-25 NOTE — Telephone Encounter (Signed)
  Follow up Call-     09/24/2022    8:34 AM  Call back number  Post procedure Call Back phone  # [phone]  Permission to leave phone message Yes     Patient questions:  Do you have a fever, pain , or abdominal swelling? No. Pain Score  0 *  Have you tolerated food without any problems? Yes.    Have you been able to return to your normal activities? Yes.    Do you have any questions about your discharge instructions: Diet   No. Medications  No. Follow up visit  No.  Do you have questions or concerns about your Care? No.  Actions: * If pain score is 4 or above: No action needed, pain <4.

## 2022-09-29 DIAGNOSIS — Z8669 Personal history of other diseases of the nervous system and sense organs: Secondary | ICD-10-CM | POA: Diagnosis not present

## 2022-09-29 DIAGNOSIS — Z9889 Other specified postprocedural states: Secondary | ICD-10-CM | POA: Diagnosis not present

## 2022-09-29 DIAGNOSIS — H6121 Impacted cerumen, right ear: Secondary | ICD-10-CM | POA: Diagnosis not present

## 2022-09-30 ENCOUNTER — Encounter: Payer: Medicaid Other | Admitting: Internal Medicine

## 2022-10-21 ENCOUNTER — Telehealth: Payer: Self-pay

## 2022-10-21 NOTE — Telephone Encounter (Signed)
Pt called stating she needs a refill on birth control pills. Pt proceeds to state she is having heavy and painful cycles while on this pill. Advised pt to continue taking pills at the same time every day. Informed pt she may need follow up appt if this continues. Pt verbalizes understanding.

## 2022-10-23 ENCOUNTER — Other Ambulatory Visit: Payer: Self-pay | Admitting: Certified Nurse Midwife

## 2022-10-23 DIAGNOSIS — N946 Dysmenorrhea, unspecified: Secondary | ICD-10-CM

## 2022-11-03 ENCOUNTER — Other Ambulatory Visit: Payer: Self-pay | Admitting: *Deleted

## 2022-11-03 DIAGNOSIS — N946 Dysmenorrhea, unspecified: Secondary | ICD-10-CM

## 2022-11-03 MED ORDER — LEVONORGEST-ETH ESTRAD 91-DAY 0.15-0.03 MG PO TABS: 1.0000 | ORAL_TABLET | Freq: Every day | ORAL | 1 refills | Status: DC

## 2022-11-18 ENCOUNTER — Ambulatory Visit: Payer: Medicaid Other | Admitting: Certified Nurse Midwife

## 2022-12-01 ENCOUNTER — Ambulatory Visit (INDEPENDENT_AMBULATORY_CARE_PROVIDER_SITE_OTHER): Payer: Medicaid Other | Admitting: *Deleted

## 2022-12-01 DIAGNOSIS — Z23 Encounter for immunization: Secondary | ICD-10-CM

## 2022-12-01 NOTE — Progress Notes (Signed)
Pt here for 2nd Gardasil vaccine per verbal from Dr. Nani Ravens since last one was 05/2022.  Vaccine given in left deltoid and patient tolerated well.  Next one scheduled 05/28/23 (pt going back to school).

## 2023-03-01 ENCOUNTER — Other Ambulatory Visit: Payer: Self-pay | Admitting: Certified Nurse Midwife

## 2023-03-01 DIAGNOSIS — N946 Dysmenorrhea, unspecified: Secondary | ICD-10-CM

## 2023-04-06 DIAGNOSIS — Z9089 Acquired absence of other organs: Secondary | ICD-10-CM | POA: Diagnosis not present

## 2023-04-06 DIAGNOSIS — Z8669 Personal history of other diseases of the nervous system and sense organs: Secondary | ICD-10-CM | POA: Diagnosis not present

## 2023-04-10 DIAGNOSIS — Z9089 Acquired absence of other organs: Secondary | ICD-10-CM | POA: Diagnosis not present

## 2023-04-10 DIAGNOSIS — Z8669 Personal history of other diseases of the nervous system and sense organs: Secondary | ICD-10-CM | POA: Diagnosis not present

## 2023-04-23 ENCOUNTER — Other Ambulatory Visit: Payer: Self-pay | Admitting: Certified Nurse Midwife

## 2023-04-23 DIAGNOSIS — N946 Dysmenorrhea, unspecified: Secondary | ICD-10-CM

## 2023-05-27 NOTE — Progress Notes (Signed)
Jody Murray is a 27 y.o. female presents to the office today for HPV #3  injection, per physician's orders. Original order: 12/01/2022 HPV recumbent 0.5  IM was administered L deltoid today. Patient tolerated injection. Patient due for follow up labs/provider appt: No.  Patient next injection due: n/a  Creft, Melton Alar L

## 2023-05-28 ENCOUNTER — Telehealth: Payer: Self-pay

## 2023-05-28 ENCOUNTER — Ambulatory Visit (INDEPENDENT_AMBULATORY_CARE_PROVIDER_SITE_OTHER): Payer: Medicaid Other

## 2023-05-28 DIAGNOSIS — Z23 Encounter for immunization: Secondary | ICD-10-CM | POA: Diagnosis not present

## 2023-05-28 NOTE — Telephone Encounter (Signed)
She may need titres? Do we have the form?

## 2023-05-28 NOTE — Telephone Encounter (Signed)
Placed in folder so you can review.

## 2023-05-28 NOTE — Telephone Encounter (Signed)
Pt was in today to receive her final dose of HPV. Mother Valentina Gu) also brought in a form that needed to be completed for the pt to be able to attend school at [employer].  The issue is that the following vaccines are needed: Dtap, IPV, HepB, and MMR- we do not have recs of any of these.   Pts mother says they resided in [place] before establishing here in October 2017. They are unsure of the name of the office that she used to go to in [place].   Pt starts school 8/20, please advise on how to move forward.

## 2023-05-28 NOTE — Telephone Encounter (Signed)
Mothers phone number: [phone] Fathers phone number : [phone] Antonique's phone number is: [phone]

## 2023-05-28 NOTE — Telephone Encounter (Signed)
PCP has reviewed the form/he does need patients immunization records. I called the mom and informed of this. She is going to call the Health Dept in [place] first/then try to remember the peds office she went to. Informed once she does this she can come to our office with that information and fax a release of records. The mom verbalized information.

## 2023-06-01 ENCOUNTER — Encounter: Payer: Self-pay | Admitting: Family Medicine

## 2023-06-01 NOTE — Telephone Encounter (Signed)
Made a copy for scan Sent my chart msg to the patient she can pickup at the front desk

## 2023-06-01 NOTE — Telephone Encounter (Signed)
Have updated patients immunizations in EPIC, received record per my chart. Is in folder for PCP to review

## 2023-07-13 ENCOUNTER — Encounter: Payer: Self-pay | Admitting: Family Medicine

## 2023-07-13 ENCOUNTER — Ambulatory Visit: Payer: Medicaid Other | Admitting: Family Medicine

## 2023-07-13 VITALS — BP 110/68 | HR 65 | Temp 99.0°F | Ht 63.0 in | Wt 122.4 lb

## 2023-07-13 DIAGNOSIS — J309 Allergic rhinitis, unspecified: Secondary | ICD-10-CM | POA: Diagnosis not present

## 2023-07-13 LAB — POCT INFLUENZA A/B
Influenza A, POC: NEGATIVE
Influenza B, POC: NEGATIVE

## 2023-07-13 LAB — POC COVID19 BINAXNOW: SARS Coronavirus 2 Ag: NEGATIVE

## 2023-07-13 MED ORDER — PREDNISONE 20 MG PO TABS: 40.0000 mg | ORAL_TABLET | Freq: Every day | ORAL | 0 refills | Status: AC

## 2023-07-13 MED ORDER — BENZONATATE 200 MG PO CAPS: 200.0000 mg | ORAL_CAPSULE | Freq: Two times a day (BID) | ORAL | 0 refills | Status: DC | PRN

## 2023-07-13 NOTE — Progress Notes (Signed)
Chief Complaint  Patient presents with   Ear Pain    Sore throat Fever     Jody Murray here for URI complaints. Here w mom.   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  Past Medical History:  Diagnosis Date   Complication of anesthesia    PONV, takes a little longer waking up   Congenital deafness    Rt ear   History of chicken pox    History of kidney stones    Pneumonia    age 27    Objective BP 110/68 (BP Location: Left Arm, Patient Position: Sitting, Cuff Size: Normal)   Pulse 65   Temp 99 F (37.2 C) (Oral)   Ht 5\' 3"  (1.6 m)   Wt 122 lb 6 oz (55.5 kg)   SpO2 99%   BMI 21.68 kg/m  General: Awake, alert, appears stated age HEENT: AT, [place], ear on the left patent, TM neg, nares patent w/o discharge, pharynx with slight erythema and without exudates, MMM, no sinus TTP bilaterally Neck: No masses or asymmetry, no tender adenopathy Heart: RRR Lungs: CTAB, no accessory muscle use Psych: Age appropriate judgment and insight, normal mood and affect  Allergic rhinitis, unspecified seasonality, unspecified trigger - Plan: POC COVID-19, POCT Influenza A/B  Suspect allergic rhinitis given eye symptoms and improvement with Pataday drops.  5-day prednisone burst 40 mg daily.  Cough medicine as needed.  Send message in 2 days if no improvement.  Continue to push fluids, practice good hand hygiene, cover mouth when coughing. Letter for school provided. F/u prn. If starting to experience fevers, shaking, or shortness of breath, seek immediate care. Pt and her mother voiced understanding and agreement to the plan.  Jilda Roche Andres, DO 07/13/23 10:30 AM

## 2023-07-13 NOTE — Patient Instructions (Addendum)
Continue to push fluids, practice good hand hygiene, and cover your mouth if you cough.  If you start having fevers, shaking or shortness of breath, seek immediate care.  Consider throat lozenges, salt water gargles and an air humidifier for symptomatic care.   Ibuprofen 400-600 mg (2-3 over the counter strength tabs) every 6 hours as needed for pain.  OK to take Tylenol 1000 mg (2 extra strength tabs) or 975 mg (3 regular strength tabs) every 6 hours as needed.  Send me a message in 2 days if no better.   Let us know if you need anything.

## 2023-07-15 ENCOUNTER — Encounter: Payer: Self-pay | Admitting: Family Medicine

## 2023-09-07 ENCOUNTER — Ambulatory Visit: Payer: Medicaid Other | Admitting: Obstetrics and Gynecology

## 2023-09-07 ENCOUNTER — Encounter: Payer: Self-pay | Admitting: Obstetrics and Gynecology

## 2023-09-07 VITALS — BP 129/77 | HR 76 | Ht 63.0 in | Wt 114.0 lb

## 2023-09-07 DIAGNOSIS — Z23 Encounter for immunization: Secondary | ICD-10-CM

## 2023-09-07 DIAGNOSIS — Z3009 Encounter for other general counseling and advice on contraception: Secondary | ICD-10-CM

## 2023-09-07 MED ORDER — DROSPIRENONE-ETHINYL ESTRADIOL 3-0.02 MG PO TABS
1.0000 | ORAL_TABLET | Freq: Every day | ORAL | 11 refills | Status: DC
Start: 2023-09-07 — End: 2024-02-02

## 2023-09-14 DIAGNOSIS — H6121 Impacted cerumen, right ear: Secondary | ICD-10-CM | POA: Diagnosis not present

## 2023-09-14 DIAGNOSIS — Z9089 Acquired absence of other organs: Secondary | ICD-10-CM | POA: Diagnosis not present

## 2023-09-14 DIAGNOSIS — Z8669 Personal history of other diseases of the nervous system and sense organs: Secondary | ICD-10-CM | POA: Diagnosis not present

## 2023-09-19 NOTE — Progress Notes (Signed)
ANNUAL EXAM Patient name: Jody Murray MRN [id number]  Date of birth: November 05, 1995 Chief Complaint:   Discuss Birth Control  History of Present Illness:   Jody Murray is a 27 y.o. G0P0000 Caucasian female being seen today for a routine annual exam.  Current complaints:  irregular menstrual cycle. Currently on seasonale and reports irregular bleeding and spotting in between cycles. Would like to discuss other options for [facility].   Patient's last menstrual period was 08/15/2023.   The pregnancy intention screening data noted above was reviewed. Potential methods of contraception were discussed. The patient elected to proceed with No data recorded.   Last pap Never. Severe anxiety regarding PAP      07/13/2023    8:54 AM 06/02/2022   10:52 AM 05/20/2021    9:55 AM  Depression screen PHQ 2/9  Decreased Interest 0 0 0  Down, Depressed, Hopeless 0 0 0  PHQ - 2 Score 0 0 0  Altered sleeping  0   Tired, decreased energy  0   Change in appetite  0   Feeling bad or failure about yourself   0   Trouble concentrating  0   Moving slowly or fidgety/restless  0   Suicidal thoughts  0   PHQ-9 Score  0   Difficult doing work/chores  Not difficult at all          No data to display           Review of Systems:   Pertinent items are noted in HPI Denies any headaches, blurred vision, fatigue, shortness of breath, chest pain, abdominal pain, abnormal vaginal discharge/itching/odor/irritation, problems with periods, bowel movements, urination, or intercourse unless otherwise stated above. Pertinent History Reviewed:  Reviewed past medical,surgical, social and family history.  Reviewed problem list, medications and allergies. Physical Assessment:   Vitals:   09/07/23 1447  BP: 129/77  Pulse: 76  Weight: 114 lb (51.7 kg)  Height: 5\' 3"  (1.6 m)  Body mass index is 20.19 kg/m.        Physical Examination:   General appearance - well appearing, and in no distress  Mental status -  alert, oriented to person, place, and time  Psych:  She has a normal mood and affect  Skin - warm and dry, normal color, no suspicious lesions noted  Chest - effort normal, all lung fields clear to auscultation bilaterally  Heart - normal rate and regular rhythm  Neck:  midline trachea, no thyromegaly or nodules  Breasts - breasts appear normal, no suspicious masses, no skin or nipple changes or  axillary nodes  Abdomen - soft, nontender, nondistended, no masses or organomegaly  Pelvic - VULVA: normal appearing vulva with no masses, tenderness or lesions  VAGINA: normal appearing vagina with normal color and discharge, no     Attempted exam with chaperone present. Upon insertion of speculum, patient became upset and intolerable of exam. Unable to tolerate even a single finger in the vagina. The exam was stopped due to patient request.  Thin prep pap is not done   UTERUS: Unable to complete exam due to patient's fear/discomfort.   Chaperone present for exam  No results found for this or any previous visit (from the past 24 hour(s)).  Assessment & Plan:   1. Flu vaccine need  - Flu vaccine trivalent PF, 6mos and older(Flulaval,Afluria,Fluarix,Fluzone)   2. Birth control counseling  - Will trial Yaz. - Would consider Pelvic PT. Patient will consider.   Orders Placed This Encounter  Procedures   Flu vaccine trivalent PF, 6mos and older(Flulaval,Afluria,Fluarix,Fluzone)    Meds:  Meds ordered this encounter  Medications   drospirenone-ethinyl estradiol (YAZ) 3-0.02 MG tablet    Sig: Take 1 tablet by mouth daily.    Dispense:  28 tablet    Refill:  11    Follow-up: No follow-ups on file.  Venia Carbon, NP 09/19/2023 8:14 AM

## 2023-09-20 ENCOUNTER — Other Ambulatory Visit: Payer: Self-pay | Admitting: Internal Medicine

## 2023-09-20 DIAGNOSIS — R109 Unspecified abdominal pain: Secondary | ICD-10-CM

## 2024-02-02 ENCOUNTER — Encounter: Payer: Self-pay | Admitting: Obstetrics and Gynecology

## 2024-02-02 MED ORDER — DROSPIRENONE-ETHINYL ESTRADIOL 3-0.02 MG PO TABS: 1.0000 | ORAL_TABLET | Freq: Every day | ORAL | 13 refills | Status: DC

## 2024-02-02 NOTE — Addendum Note (Signed)
 Addended by: Loralyn Rochester on: 02/02/2024 08:56 PM   Modules accepted: Orders

## 2024-02-09 ENCOUNTER — Other Ambulatory Visit: Payer: Self-pay | Admitting: Obstetrics and Gynecology

## 2024-03-14 DIAGNOSIS — H6121 Impacted cerumen, right ear: Secondary | ICD-10-CM | POA: Diagnosis not present

## 2024-04-20 ENCOUNTER — Emergency Department (HOSPITAL_BASED_OUTPATIENT_CLINIC_OR_DEPARTMENT_OTHER)

## 2024-04-20 ENCOUNTER — Encounter: Payer: Self-pay | Admitting: Physician Assistant

## 2024-04-20 ENCOUNTER — Encounter (HOSPITAL_BASED_OUTPATIENT_CLINIC_OR_DEPARTMENT_OTHER): Payer: Self-pay | Admitting: Emergency Medicine

## 2024-04-20 ENCOUNTER — Emergency Department (HOSPITAL_BASED_OUTPATIENT_CLINIC_OR_DEPARTMENT_OTHER)
Admission: EM | Admit: 2024-04-20 | Discharge: 2024-04-20 | Disposition: A | Discharge status: Home / Self Care | Attending: Emergency Medicine | Admitting: Emergency Medicine

## 2024-04-20 ENCOUNTER — Ambulatory Visit (INDEPENDENT_AMBULATORY_CARE_PROVIDER_SITE_OTHER): Admitting: Physician Assistant

## 2024-04-20 ENCOUNTER — Ambulatory Visit: Payer: Self-pay

## 2024-04-20 ENCOUNTER — Other Ambulatory Visit: Payer: Self-pay

## 2024-04-20 VITALS — BP 93/57 | HR 64 | Ht 63.0 in | Wt 112.2 lb

## 2024-04-20 DIAGNOSIS — R42 Dizziness and giddiness: Secondary | ICD-10-CM

## 2024-04-20 DIAGNOSIS — R2681 Unsteadiness on feet: Secondary | ICD-10-CM | POA: Diagnosis not present

## 2024-04-20 DIAGNOSIS — R269 Unspecified abnormalities of gait and mobility: Secondary | ICD-10-CM | POA: Diagnosis not present

## 2024-04-20 DIAGNOSIS — I951 Orthostatic hypotension: Secondary | ICD-10-CM | POA: Diagnosis not present

## 2024-04-20 DIAGNOSIS — I959 Hypotension, unspecified: Secondary | ICD-10-CM | POA: Diagnosis not present

## 2024-04-20 LAB — URINALYSIS, ROUTINE W REFLEX MICROSCOPIC
Bilirubin Urine: NEGATIVE
Glucose, UA: NEGATIVE mg/dL
Ketones, ur: NEGATIVE mg/dL
Nitrite: NEGATIVE
Protein, ur: NEGATIVE mg/dL
Specific Gravity, Urine: 1.015 (ref 1.005–1.030)
pH: 7 (ref 5.0–8.0)

## 2024-04-20 LAB — COMPREHENSIVE METABOLIC PANEL WITH GFR
ALT: 9 U/L (ref 0–44)
AST: 15 U/L (ref 15–41)
Albumin: 4.6 g/dL (ref 3.5–5.0)
Alkaline Phosphatase: 57 U/L (ref 38–126)
Anion gap: 14 (ref 5–15)
BUN: 7 mg/dL (ref 6–20)
CO2: 23 mmol/L (ref 22–32)
Calcium: 10.2 mg/dL (ref 8.9–10.3)
Chloride: 100 mmol/L (ref 98–111)
Creatinine, Ser: 0.61 mg/dL (ref 0.44–1.00)
GFR, Estimated: >60 mL/min (ref >60)
Glucose, Bld: 93 mg/dL (ref 70–99)
Potassium: 3.8 mmol/L (ref 3.5–5.1)
Sodium: 138 mmol/L (ref 135–145)
Total Bilirubin: 0.4 mg/dL (ref 0.0–1.2)
Total Protein: 7.3 g/dL (ref 6.5–8.1)

## 2024-04-20 LAB — URINALYSIS, MICROSCOPIC (REFLEX)

## 2024-04-20 LAB — CBC
HCT: 39.6 % (ref 36.0–46.0)
Hemoglobin: 13.7 g/dL (ref 12.0–15.0)
MCH: 32 pg (ref 26.0–34.0)
MCHC: 34.6 g/dL (ref 30.0–36.0)
MCV: 92.5 fL (ref 80.0–100.0)
Platelets: 304 10*3/uL (ref 150–400)
RBC: 4.28 MIL/uL (ref 3.87–5.11)
RDW: 12.3 % (ref 11.5–15.5)
WBC: 8.4 10*3/uL (ref 4.0–10.5)
nRBC: 0 % (ref 0.0–0.2)

## 2024-04-20 LAB — PREGNANCY, URINE: Preg Test, Ur: NEGATIVE

## 2024-04-20 LAB — GLUCOSE, POCT (MANUAL RESULT ENTRY): POC Glucose: 89 mg/dL (ref 70–99)

## 2024-04-20 LAB — CBG MONITORING, ED: Glucose-Capillary: 91 mg/dL (ref 70–99)

## 2024-04-20 MED ORDER — PROCHLORPERAZINE EDISYLATE 10 MG/2ML IJ SOLN
10.0000 mg | Freq: Once | INTRAMUSCULAR | Status: AC
Start: 2024-04-20 — End: 2024-04-20
  Administered 2024-04-20: 10 mg via INTRAVENOUS
  Filled 2024-04-20: qty 2

## 2024-04-20 MED ORDER — MECLIZINE HCL 25 MG PO TABS: 25.0000 mg | ORAL_TABLET | Freq: Three times a day (TID) | ORAL | 0 refills | Status: AC | PRN

## 2024-04-20 MED ORDER — SODIUM CHLORIDE 0.9 % IV BOLUS
1000.0000 mL | Freq: Once | INTRAVENOUS | Status: AC
  Administered 2024-04-20: 1000 mL via INTRAVENOUS

## 2024-04-20 MED ORDER — ONDANSETRON 4 MG PO TBDP: 4.0000 mg | ORAL_TABLET | Freq: Three times a day (TID) | ORAL | 0 refills | Status: AC | PRN

## 2024-04-20 MED ORDER — DIPHENHYDRAMINE HCL 50 MG/ML IJ SOLN
25.0000 mg | Freq: Once | INTRAMUSCULAR | Status: AC
  Administered 2024-04-20: 25 mg via INTRAVENOUS
  Filled 2024-04-20: qty 1

## 2024-04-20 NOTE — ED Triage Notes (Signed)
 Pt POV in wheelchair with parents- pt reports new dizziness and emesis x2 due to dizziness since yesterday AM, mild nausea today.   Dizziness began appx 0700 04/19/2024. Reports room spinning, worse with ambulation. Reports goo PO intake today.   AOx4, VAN neg.

## 2024-04-20 NOTE — ED Provider Notes (Signed)
 [place] [facility] DEPARTMENT AT [facility] Provider Note   CSN: [id number] Arrival date & time: 04/20/24  1122     Patient presents with: Dizziness   Jody Murray is a 28 y.o. female.    Patient with history of congenital right ear deafness --presents to the emergency department after being seen by PCP today for dizziness described as imbalance and spinning sensation.  Symptoms started yesterday morning.  Patient had associated episodes of vomiting.  No associated headache.  She had a brief episode of chest pain.  Walking has been very difficult due to the dizziness.  No falls or head injuries.  She needed significant assistance while at PCP today.  Also found to be hypotensive on orthostatics.  Sent to the ED for further evaluation.  Patient continues to have nausea.  Vomiting is improving today.  Overall dizziness is improving.  Patient denies signs of stroke including: facial droop, slurred speech, aphasia, weakness/numbness in extremities.       Prior to Admission medications   Medication Sig Start Date End Date Taking? Authorizing Provider  dicyclomine  (BENTYL ) 20 MG tablet Bentyl  20 mg po every 6 hours as needed for severe pain Patient not taking: Reported on 09/07/2023 09/24/22   Abran Norleen SAILOR, MD  NIKKI  3-0.02 MG tablet PLEASE SEE ATTACHED FOR DETAILED DIRECTIONS 02/11/24   Rudy Carlin LABOR, MD    Allergies: Gluten meal and Milk (cow)    Review of Systems  Updated Vital Signs BP 118/76   Pulse 77   Temp 99.7 F (37.6 C)   Resp 19   Ht 5' 3 (1.6 m)   Wt 50.9 kg   LMP 01/22/2024   SpO2 99%   BMI 19.88 kg/m   Physical Exam Vitals and nursing note reviewed.  Constitutional:      Appearance: She is well-developed.  HENT:     Head: Normocephalic and atraumatic.     Right Ear: Ear canal and external ear normal. Tympanic membrane is scarred. Tympanic membrane is not erythematous.     Left Ear: Tympanic membrane, ear canal and external ear  normal. Tympanic membrane is not erythematous.     Ears:     Comments: Opaque R TM    Nose: Nose normal.     Mouth/Throat:     Mouth: Mucous membranes are moist.     Pharynx: Uvula midline.  Eyes:     General: Lids are normal.     Extraocular Movements:     Right eye: No nystagmus.     Left eye: No nystagmus.     Conjunctiva/sclera: Conjunctivae normal.     Pupils: Pupils are equal, round, and reactive to light.     Comments: No nystagmus noted.  Cardiovascular:     Rate and Rhythm: Normal rate and regular rhythm.  Pulmonary:     Effort: Pulmonary effort is normal.     Breath sounds: Normal breath sounds.  Abdominal:     Palpations: Abdomen is soft.     Tenderness: There is no abdominal tenderness.  Musculoskeletal:     Cervical back: Normal range of motion and neck supple. No tenderness or bony tenderness.  Skin:    General: Skin is warm and dry.  Neurological:     Mental Status: She is alert and oriented to person, place, and time.     GCS: GCS eye subscore is 4. GCS verbal subscore is 5. GCS motor subscore is 6.     Cranial Nerves: No cranial nerve  deficit.     Sensory: No sensory deficit.     Motor: No weakness.     Coordination: Coordination normal.     Gait: Gait abnormal.     Comments: Patient is able to ambulate independently but she is unsteady and gait is abnormal.  Upper extremity myotomes tested bilaterally:  C5 Shoulder abduction 5/5 C6 Elbow flexion/wrist extension 5/5 C7 Elbow extension 5/5 C8 Finger flexion 5/5 T1 Finger abduction 5/5  Lower extremity myotomes tested bilaterally: L2 Hip flexion 5/5 L3 Knee extension 5/5 L4 Ankle dorsiflexion 5/5 S1 Ankle plantar flexion 5/5      (all labs ordered are listed, but only abnormal results are displayed) Labs Reviewed  URINALYSIS, ROUTINE W REFLEX MICROSCOPIC - Abnormal; Notable for the following components:      Result Value   APPearance HAZY (*)    Hgb urine dipstick TRACE (*)    Leukocytes,Ua  SMALL (*)    All other components within normal limits  URINALYSIS, MICROSCOPIC (REFLEX) - Abnormal; Notable for the following components:   Bacteria, UA RARE (*)    All other components within normal limits  COMPREHENSIVE METABOLIC PANEL WITH GFR  CBC  PREGNANCY, URINE  CBG MONITORING, ED    ED ECG REPORT   Date: 04/20/2024  Rate: 66  Rhythm: normal sinus rhythm  QRS Axis: normal  Intervals: normal  ST/T Wave abnormalities: nonspecific T wave changes  Conduction Disutrbances:none  Narrative Interpretation:   Old EKG Reviewed: none available  I have personally reviewed the EKG tracing and agree with the computerized printout as noted.   Radiology: CT Head Wo Contrast Result Date: 04/20/2024 CLINICAL DATA:  Vertigo, abnormal gait. Dizziness and emesis since yesterday morning. EXAM: CT HEAD WITHOUT CONTRAST TECHNIQUE: Contiguous axial images were obtained from the base of the skull through the vertex without intravenous contrast. RADIATION DOSE REDUCTION: This exam was performed according to the departmental dose-optimization program which includes automated exposure control, adjustment of the mA and/or kV according to patient size and/or use of iterative reconstruction technique. COMPARISON:  None Available. FINDINGS: Brain: No acute intracranial hemorrhage. No CT evidence of acute infarct. No edema, mass effect, or midline shift. The basilar cisterns are patent. Ventricles: The ventricles are normal. Vascular: No hyperdense vessel or unexpected calcification. Skull: No acute or aggressive finding. Orbits: Orbits are symmetric. Sinuses: The visualized paranasal sinuses are clear. Other: Small right mastoid effusion. IMPRESSION: No CT evidence of acute intracranial abnormality. Small right mastoid effusion. Recommend clinical correlation for mastoiditis. Electronically Signed   By: Donnice Mania M.D.   On: 04/20/2024 16:18     Procedures   Medications Ordered in the ED  sodium  chloride 0.9 % bolus 1,000 mL (has no administration in time range)  prochlorperazine  (COMPAZINE ) injection 10 mg (has no administration in time range)  diphenhydrAMINE  (BENADRYL ) injection 25 mg (has no administration in time range)   ED Course  Patient seen and examined. History obtained directly from patient. Work-up including labs, imaging, EKG ordered in triage, if performed, were reviewed.    Labs/EKG: Independently reviewed and interpreted.  This included: CBC normal; CMP normal; CBG normal.  UA and pregnancy pending.  Imaging: Ordered CT head  Medications/Fluids: Ordered: Compazine  and Benadryl  for nausea and vertigo type symptoms, IV fluid bolus.  Most recent vital signs reviewed and are as follows: BP 118/76   Pulse 77   Temp 99.7 F (37.6 C)   Resp 19   Ht 5' 3 (1.6 m)   Wt 50.9 kg  LMP 01/22/2024   SpO2 99%   BMI 19.88 kg/m   Initial impression: Patient's symptoms including a motion sensation, vomiting, worse with head movements --sounds concerning for BPPV.  However patient's gait abnormality is more than I would typically expect and she does not have obvious nystagmus on exam.  Will treat symptoms, obtain head imaging with CT, and reassess.  5:36 PM Reassessment performed. Patient appears comfortable.  Sitting up on side of the bed.  Patient states that she feels less dizzy and ambulated well.  Paramedic also reports steady ambulation.  Parent at bedside also states that she is walking better now.  Labs personally reviewed and interpreted including: Pregnancy negative, UA without compelling signs of infection.  Personally reviewed and interpreted head CT which appears negative.  Reviewed pertinent lab work and imaging with patient at bedside. Questions answered.  Patient has improved clinically with treatment, overall symptoms are consistent with peripheral vertigo.  Most current vital signs reviewed and are as follows: BP 110/63   Pulse 74   Temp 98.2 F (36.8  C) (Oral)   Resp 16   Ht 5' 3 (1.6 m)   Wt 50.9 kg   LMP 01/22/2024   SpO2 100%   BMI 19.88 kg/m   Plan: Discharge to home.   Prescriptions written for: Meclizine  and Zofran   Other home care instructions discussed: Monitoring of symptoms  ED return instructions discussed: Patient counseled to return if they have weakness in their arms or legs, slurred speech, trouble walking or talking, confusion, trouble with their balance, or if they have any other concerns. Patient verbalizes understanding and agrees with plan.   Follow-up instructions discussed: Patient encouraged to follow-up with their PCP in 3 days.                                    Medical Decision Making Amount and/or Complexity of Data Reviewed Labs: ordered. Radiology: ordered.  Risk Prescription drug management.   Patient with dizziness, worse with movement, vomiting.  History suggestive of peripheral vertigo, however patient's ataxia symptoms seem to be worse than what I would typically expect with that.  Patient was orthostatic at PCP office today.  Patient was treated with IV fluids, medication for nausea and vertigo including Compazine  and Benadryl .  This seems to have helped as patient now has improved ambulation.  Nonfocal neuroexam.  Considered transfer for MRI, however do not feel that this is necessary at this time given improvement with treatment.  Patient will be given symptom control for home and encouraged follow-up with PCP in a few days for recheck.  The patient's vital signs, pertinent lab work and imaging were reviewed and interpreted as discussed in the ED course. Hospitalization was considered for further testing, treatments, or serial exams/observation. However as patient is well-appearing, has a stable exam, and reassuring studies today, I do not feel that they warrant admission at this time. This plan was discussed with the patient who verbalizes agreement and comfort with this plan and seems  reliable and able to return to the Emergency Department with worsening or changing symptoms.       Final diagnoses:  Dizziness  Vertigo  Orthostatic hypotension    ED Discharge Orders          Ordered    meclizine  (ANTIVERT ) 25 MG tablet  3 times daily PRN        04/20/24 1730    ondansetron  (  ZOFRAN -ODT) 4 MG disintegrating tablet  Every 8 hours PRN        04/20/24 1730               Ferris Fielden, PA-C 04/20/24 1740    Zackowski, Scott, MD 04/21/24 [phone]

## 2024-04-20 NOTE — Telephone Encounter (Signed)
 Appointment made for today 04/20/2024 at 11 AM with Manuelita Flatness at PCP office.   FYI Only or Action Required?: FYI only for provider.  Patient was last seen in primary care on 07/13/2023 by Frann Mabel Mt, DO. Called Nurse Triage reporting Dizziness. Symptoms began yesterday. Interventions attempted: Rest, hydration, or home remedies. Symptoms are: unchanged.  Triage Disposition: Go to ED or PCP/Alternative with Approval  Patient/caregiver understands and will follow disposition?: No, wishes to speak with PCP--consulted with CAL and appt made for the office with ED precautions       This RN called the CAL and advised them of the patient's current symptoms and patient/patient's mother refusing ED at this time and wanting to schedule an office appointment.  It was advised that an office appointment could be made so the patient can be taken care of but if anything is found in the offce to be emergent that the providers feel needs to be evaluated in the Emergency Room--patient will be sent there. This is explained to the patient and her mother and they are agreeable with this plan.            Copied from CRM [id number]. Topic: Clinical - Red Word Triage >> Apr 20, 2024  8:00 AM Robinson H wrote: [facility] that prompted transfer to Nurse Triage: Lost balance yesterday been really dizzy,, vomiting, not walking straight wobbly while walking. Reason for Disposition . SEVERE dizziness (e.g., unable to stand, requires support to walk, feels like passing out now)  Answer Assessment - Initial Assessment Questions 1. DESCRIPTION: Describe your dizziness.     Has to hold on to things, nausea/vomiting, feels like she might pass out 2. LIGHTHEADED: Do you feel lightheaded? (e.g., somewhat faint, woozy, weak upon standing)     dizzy 3. VERTIGO: Do you feel like either you or the room is spinning or tilting? (i.e. vertigo)     No 4. SEVERITY: How bad is it?  Do you feel  like you are going to faint? Can you stand and walk?   - MILD: Feels slightly dizzy, but walking normally.   - MODERATE: Feels unsteady when walking, but not falling; interferes with normal activities (e.g., school, work).   - SEVERE: Unable to walk without falling, or requires assistance to walk without falling; feels like passing out now.      Severe per patient 5. ONSET:  When did the dizziness begin?     Yesterday morning 6. AGGRAVATING FACTORS: Does anything make it worse? (e.g., standing, change in head position)     Standing/walking,  7. HEART RATE: Can you tell me your heart rate? How many beats in 15 seconds?  (Note: not all patients can do this)       N/A 8. CAUSE: What do you think is causing the dizziness?     Unsure 9. RECURRENT SYMPTOM: Have you had dizziness before? If Yes, ask: When was the last time? What happened that time?     No 10. OTHER SYMPTOMS: Do you have any other symptoms? (e.g., fever, chest pain, vomiting, diarrhea, bleeding)       Chest hurt yesterday for a split second and mother gave an antiacid, clammy yesterday 11. PREGNANCY: Is there any chance you are pregnant? When was your last menstrual period?       No---on birth control    Patient stated that at one point she had chest pain for a split second at one point. With severe dizziness where the patient  feels like she cannot walk or stand without holding on to something due to the fear of falling out---it is recommended that the patient be seen and evaluated at the Emergency Room. Mother states that they didn't want to go to the Emergency Room and wait for hours and that they just wanted to make an office appointment. This RN consulted with the CAL at the office in order to get the patient taken care of today since they were refusing the Emergency Room. This RN also advised the patient and her mother that if anything got worsemto go immediately to the Emergency Room and they  verbalized understanding of this.  Protocols used: Dizziness - Lightheadedness-A-AH

## 2024-04-20 NOTE — ED Notes (Signed)
 Pt ambulated with a steady gate and independently.  She denies any further dizziness at this time.

## 2024-04-20 NOTE — Discharge Instructions (Addendum)
 Please read and follow all provided instructions.  Your diagnoses today include:  1. Dizziness   2. Vertigo    Tests performed today include: Complete blood cell count: Normal hemoglobin Basic metabolic panel: Normal electrolytes Urinalysis (urine test): No definite infection Vital signs. See below for your results today.   Medications prescribed:  Meclizine: Medication for vertigo  Zofran  (ondansetron ) - for nausea and vomiting  Take any prescribed medications only as directed.  Home care instructions:  Follow any educational materials contained in this packet.  BE VERY CAREFUL not to take multiple medicines containing Tylenol  (also called acetaminophen ). Doing so can lead to an overdose which can damage your liver and cause liver failure and possibly death.   Follow-up instructions: Please follow-up with your primary care provider in the next 3 days for further evaluation of your symptoms.   Return instructions:  Please return to the Emergency Department if you experience worsening symptoms.  Please return if you have any other emergent concerns.  Additional Information:  Your vital signs today were: BP 110/63   Pulse 74   Temp 98.2 F (36.8 C) (Oral)   Resp 16   Ht 5' 3 (1.6 m)   Wt 50.9 kg   LMP 01/22/2024   SpO2 100%   BMI 19.88 kg/m  If your blood pressure (BP) was elevated above 135/85 this visit, please have this repeated by your doctor within one month. --------------

## 2024-04-20 NOTE — Progress Notes (Signed)
 Established patient visit   Patient: Jody Murray   DOB: [birth year]   27 y.o. Female  MRN: [id number] Visit Date: 04/20/2024  Today's healthcare provider: Manuelita Flatness, PA-C   Cc. Dizziness, nausea, vomiting  Subjective      Pt reports sudden onset dizziness, nausea, vomiting yesterday. Unable to keep down food or drink, unable to walk 2/2 to dizziness.  Today, nausea persists but is less severe, and vomiting has ceased. Still unable to walk without assistance. Denies dizziness at rest, predominantly when ambulating.  No headache, vision changes, paresthesias or weakness are present.   She had a brief episode of right-sided chest pain while lying down yesterday, which resolved quickly without shortness of breath, numbness, diaphoresis.  Medications: Outpatient Medications Prior to Visit  Medication Sig   dicyclomine  (BENTYL ) 20 MG tablet Bentyl  20 mg po every 6 hours as needed for severe pain (Patient not taking: Reported on 09/07/2023)   NIKKI  3-0.02 MG tablet PLEASE SEE ATTACHED FOR DETAILED DIRECTIONS   No facility-administered medications prior to visit.    Review of Systems  Constitutional:  Negative for fatigue and fever.  Respiratory:  Negative for cough and shortness of breath.   Cardiovascular:  Negative for chest pain and leg swelling.  Gastrointestinal:  Positive for nausea and vomiting. Negative for abdominal pain.  Neurological:  Positive for dizziness. Negative for facial asymmetry, speech difficulty, weakness and headaches.       Objective    BP (!) 93/57   Pulse 64   Ht 5' 3 (1.6 m)   Wt 112 lb 3.2 oz (50.9 kg)   BMI 19.88 kg/m    Physical Exam Constitutional:      General: She is awake.     Appearance: She is well-developed.  HENT:     Head: Normocephalic.  Eyes:     Extraocular Movements: Extraocular movements intact.     Conjunctiva/sclera: Conjunctivae normal.     Pupils: Pupils are equal, round, and reactive to light.   Cardiovascular:     Rate and Rhythm: Normal rate and regular rhythm.     Heart sounds: Normal heart sounds.  Pulmonary:     Effort: Pulmonary effort is normal.     Breath sounds: Normal breath sounds.  Skin:    General: Skin is warm.  Neurological:     Mental Status: She is alert and oriented to person, place, and time.     Sensory: Sensation is intact.     Motor: No weakness.     Coordination: Coordination is intact.     Gait: Gait abnormal.     Comments: Pt denies dizziness at rest, negative epley maneuver.  When standing, she is unable to ambulate without assistance, very unsteady, would have fallen if I was not walking with her  Psychiatric:        Attention and Perception: Attention normal.        Mood and Affect: Mood normal.        Speech: Speech normal.        Behavior: Behavior is cooperative.      Results for orders placed or performed in visit on 04/20/24  POCT Glucose (CBG)  Result Value Ref Range   POC Glucose 89 70 - 99 mg/dl    Assessment & Plan    Dizziness -     POCT glucose (manual entry)  Hypotension, unspecified hypotension type  Unstable gait   Hypotensive, n/v, severe dizziness and unstable gait. Referring down to ED,  pt and family agreeable, brought down in wheelchair  Manuelita Flatness, PA-C  [facility] [phone] (phone) [phone] (fax)  [facility]

## 2024-09-19 DIAGNOSIS — H6121 Impacted cerumen, right ear: Secondary | ICD-10-CM | POA: Diagnosis not present

## 2024-09-19 DIAGNOSIS — Z9089 Acquired absence of other organs: Secondary | ICD-10-CM | POA: Diagnosis not present
